# Patient Record
Sex: Male | Born: 1975 | Race: White | Hispanic: No | Marital: Married | Smoking: Current every day smoker
Health system: Southern US, Community
[De-identification: ages and names within clinical notes are randomized; demographics above are authoritative.]

## PROBLEM LIST (undated history)

## (undated) DIAGNOSIS — M47816 Spondylosis without myelopathy or radiculopathy, lumbar region: Secondary | ICD-10-CM

## (undated) DIAGNOSIS — Z87442 Personal history of urinary calculi: Secondary | ICD-10-CM

## (undated) DIAGNOSIS — Z87898 Personal history of other specified conditions: Secondary | ICD-10-CM

## (undated) DIAGNOSIS — K219 Gastro-esophageal reflux disease without esophagitis: Secondary | ICD-10-CM

## (undated) DIAGNOSIS — Z1211 Encounter for screening for malignant neoplasm of colon: Secondary | ICD-10-CM

## (undated) DIAGNOSIS — N200 Calculus of kidney: Secondary | ICD-10-CM

## (undated) DIAGNOSIS — F339 Major depressive disorder, recurrent, unspecified: Secondary | ICD-10-CM

## (undated) DIAGNOSIS — E66811 Obesity, class 1: Secondary | ICD-10-CM

## (undated) DIAGNOSIS — J302 Other seasonal allergic rhinitis: Secondary | ICD-10-CM

## (undated) DIAGNOSIS — M5412 Radiculopathy, cervical region: Secondary | ICD-10-CM

## (undated) DIAGNOSIS — F1411 Cocaine abuse, in remission: Secondary | ICD-10-CM

## (undated) DIAGNOSIS — F17200 Nicotine dependence, unspecified, uncomplicated: Secondary | ICD-10-CM

## (undated) DIAGNOSIS — N529 Male erectile dysfunction, unspecified: Secondary | ICD-10-CM

## (undated) DIAGNOSIS — F1011 Alcohol abuse, in remission: Secondary | ICD-10-CM

## (undated) DIAGNOSIS — N201 Calculus of ureter: Secondary | ICD-10-CM

## (undated) HISTORY — DX: Personal history of other specified conditions: Z87.898

## (undated) HISTORY — DX: Obesity, class 1: E66.811

## (undated) HISTORY — DX: Male erectile dysfunction, unspecified: N52.9

## (undated) HISTORY — DX: Other seasonal allergic rhinitis: J30.2

## (undated) HISTORY — DX: Nicotine dependence, unspecified, uncomplicated: F17.200

## (undated) HISTORY — DX: Cocaine abuse, in remission: F14.11

## (undated) HISTORY — DX: Calculus of ureter: N20.1

## (undated) HISTORY — DX: Major depressive disorder, recurrent, unspecified: F33.9

## (undated) HISTORY — DX: Calculus of kidney: N20.0

## (undated) HISTORY — DX: Spondylosis without myelopathy or radiculopathy, lumbar region: M47.816

## (undated) HISTORY — DX: Radiculopathy, cervical region: M54.12

## (undated) HISTORY — DX: Alcohol abuse, in remission: F10.11

## (undated) HISTORY — DX: Gastro-esophageal reflux disease without esophagitis: K21.9

## (undated) HISTORY — DX: Encounter for screening for malignant neoplasm of colon: Z12.11

---

## 2014-12-29 HISTORY — PX: UPPER GI ENDOSCOPY: SHX6162

## 2015-03-31 HISTORY — PX: BRAVO PH STUDY: SHX5421

## 2015-04-29 HISTORY — PX: ESOPHAGEAL MANOMETRY: SHX1526

## 2016-08-21 DIAGNOSIS — F17200 Nicotine dependence, unspecified, uncomplicated: Secondary | ICD-10-CM | POA: Diagnosis not present

## 2016-08-21 DIAGNOSIS — K219 Gastro-esophageal reflux disease without esophagitis: Secondary | ICD-10-CM | POA: Diagnosis not present

## 2016-08-21 DIAGNOSIS — N528 Other male erectile dysfunction: Secondary | ICD-10-CM | POA: Diagnosis not present

## 2017-02-22 DIAGNOSIS — F17200 Nicotine dependence, unspecified, uncomplicated: Secondary | ICD-10-CM | POA: Diagnosis not present

## 2017-02-22 DIAGNOSIS — K219 Gastro-esophageal reflux disease without esophagitis: Secondary | ICD-10-CM | POA: Diagnosis not present

## 2017-03-19 DIAGNOSIS — W57XXXA Bitten or stung by nonvenomous insect and other nonvenomous arthropods, initial encounter: Secondary | ICD-10-CM | POA: Diagnosis not present

## 2017-03-19 DIAGNOSIS — R5383 Other fatigue: Secondary | ICD-10-CM | POA: Diagnosis not present

## 2017-03-19 DIAGNOSIS — J0100 Acute maxillary sinusitis, unspecified: Secondary | ICD-10-CM | POA: Diagnosis not present

## 2017-03-19 DIAGNOSIS — R509 Fever, unspecified: Secondary | ICD-10-CM | POA: Diagnosis not present

## 2017-03-19 LAB — BASIC METABOLIC PANEL
BUN: 11 (ref 4–21)
Creatinine: 1.2 (ref <=1.3)
GLUCOSE: 90
POTASSIUM: 4 (ref 3.4–5.3)
SODIUM: 140 (ref 137–147)

## 2017-03-19 LAB — HEPATIC FUNCTION PANEL
ALK PHOS: 83 (ref 25–125)
ALT: 22 (ref 10–40)
AST: 27 (ref 14–40)
BILIRUBIN, TOTAL: 6.8

## 2017-03-19 LAB — CBC AND DIFFERENTIAL
HEMATOCRIT: 44 (ref 41–53)
HEMOGLOBIN: 14.9 (ref 13.5–17.5)
NEUTROS ABS: 2
Platelets: 205 (ref 150–399)
WBC: 5.7

## 2017-10-08 DIAGNOSIS — Z87898 Personal history of other specified conditions: Secondary | ICD-10-CM

## 2017-10-08 HISTORY — DX: Personal history of other specified conditions: Z87.898

## 2017-10-10 DIAGNOSIS — R05 Cough: Secondary | ICD-10-CM | POA: Diagnosis not present

## 2017-10-10 DIAGNOSIS — M47897 Other spondylosis, lumbosacral region: Secondary | ICD-10-CM | POA: Diagnosis not present

## 2017-10-10 DIAGNOSIS — M545 Low back pain: Secondary | ICD-10-CM | POA: Diagnosis not present

## 2017-10-10 DIAGNOSIS — H6693 Otitis media, unspecified, bilateral: Secondary | ICD-10-CM | POA: Diagnosis not present

## 2017-10-10 DIAGNOSIS — M549 Dorsalgia, unspecified: Secondary | ICD-10-CM | POA: Diagnosis not present

## 2017-10-10 DIAGNOSIS — H9203 Otalgia, bilateral: Secondary | ICD-10-CM | POA: Diagnosis not present

## 2017-10-29 ENCOUNTER — Encounter: Payer: Self-pay | Admitting: *Deleted

## 2017-10-29 ENCOUNTER — Telehealth: Payer: Self-pay | Admitting: *Deleted

## 2017-10-29 NOTE — Telephone Encounter (Signed)
New pt apt on 10/31/17.  Received medical records from [facility].  I reviewed records and abstracted information into pts chart.   Records have been placed on Dr. Samul DadaMcGowen's desk for review.

## 2017-10-30 ENCOUNTER — Encounter: Payer: Self-pay | Admitting: Family Medicine

## 2017-10-31 ENCOUNTER — Ambulatory Visit: Payer: Self-pay | Admitting: Family Medicine

## 2017-11-11 ENCOUNTER — Encounter: Payer: Self-pay | Admitting: Family Medicine

## 2017-11-11 ENCOUNTER — Ambulatory Visit: Payer: BLUE CROSS/BLUE SHIELD | Admitting: Family Medicine

## 2017-11-11 VITALS — BP 114/77 | HR 74 | Temp 98.3°F | Resp 16 | Ht 70.0 in | Wt 192.0 lb

## 2017-11-11 DIAGNOSIS — N200 Calculus of kidney: Secondary | ICD-10-CM

## 2017-11-11 DIAGNOSIS — Z8249 Family history of ischemic heart disease and other diseases of the circulatory system: Secondary | ICD-10-CM | POA: Diagnosis not present

## 2017-11-11 DIAGNOSIS — K219 Gastro-esophageal reflux disease without esophagitis: Secondary | ICD-10-CM

## 2017-11-11 MED ORDER — TAMSULOSIN HCL 0.4 MG PO CAPS: ORAL_CAPSULE | ORAL | 3 refills | Status: DC

## 2017-11-11 NOTE — Progress Notes (Signed)
Office Note 11/11/2017  CC:  Chief Complaint  Patient presents with  . Establish Care    Previous PCP: Dr. Durene CalHunter  . Follow-up    GERD, needs refill of Nexium    HPI:  Larry House is a 42 y.o. male who is here to establish care Patient's most recent primary MD:  Dr. Durene CalHunter with [facility] in [place], [place].  Old records in [facility] ([facility] visit 10/10/17) were reviewed prior to or during today's visit.  Hx of globus sensation, dx'd with GERD, started nexium and has been titrated up to 40 mg qd. Was still symptomatic and went to GI MD, got EGD---signif esophagitis.  Nissen procedure considered but pt declined. Feels 80% improved on daily nexium but if he misses a dose he feels significant rebound sx's. He has adjusted diet some, elevated head of bed.    Around 10/2017 he had a resp illness and ended up going to an UC and got abx. Got back x-ray while he was there and was told he had lumbar spondylitic changes with mild spondylolisthesis. He does endorse periods of LBP with radiating pain down right leg intermittently.  Has hx of recurrent kidney stones, says he passes them about 1 time per month on average, asks for rx for flomax b/c he says it has helped in the past when he is trying to pass a stone.    Past Medical History:  Diagnosis Date  . GERD (gastroesophageal reflux disease)    24h pH probe FAILED 03/31/15.  Hx of erosive esophagitis on EGD 03/31/15  . History of alcohol abuse   . History of cocaine abuse   . Kidney stones    Ca oxalate.  Flomax helps pass.  ++Recurrent.  . Lumbar spondylosis    with mild spondylolisthesis L5 on S1.  Intermittent LBP with R leg radiculopathy.  Toma Deiters. Malaise   . Organic erectile dysfunction   . Seasonal allergic rhinitis   . Tobacco dependence     Past Surgical History:  Procedure Laterality Date  . BRAVO PH STUDY  03/31/2015   Failed 48 - hour Bravo pH probe - Dr. Cammy BrochureMark Aldous  . ESOPHAGEAL MANOMETRY  04/29/2015    Essentially normal esophageal manometry. - Dr. Cammy BrochureMark Aldous  . UPPER GI ENDOSCOPY  12/29/2014   LA Grade A reflus esophagitis, otherwise normal. - Dr. Cammy BrochureMark Aldous    Family History  Problem Relation Age of Onset  . Arthritis Father   . Early death Father   . Heart attack Father   . Heart disease Father   . Hyperlipidemia Father   . Hypertension Father   . Early death Paternal Grandfather   . Hearing loss Paternal Grandfather   . Heart attack Paternal Grandfather   . Heart disease Paternal Grandfather   . Hyperlipidemia Paternal Grandfather   . Hypertension Paternal Grandfather     Social History   Socioeconomic History  . Marital status: Married    Spouse name: Not on file  . Number of children: Not on file  . Years of education: Not on file  . Highest education level: Not on file  Social Needs  . Financial resource strain: Not on file  . Food insecurity - worry: Not on file  . Food insecurity - inability: Not on file  . Transportation needs - medical: Not on file  . Transportation needs - non-medical: Not on file  Occupational History  . Not on file  Tobacco Use  . Smoking status:  Current Every Day Smoker    Packs/day: 1.00    Years: 26.00    Pack years: 26.00    Types: Cigarettes  . Smokeless tobacco: Never Used  Substance and Sexual Activity  . Alcohol use: Yes    Alcohol/week: 7.2 oz    Types: 12 Cans of beer per week  . Drug use: No  . Sexual activity: Not on file  Other Topics Concern  . Not on file  Social History Narrative   Married, no children.   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   Tob:25 pack-yr hx as of 11/2017.   Alc: 12 pack/week.  Denies hx of abuse.   Drugs:hx of street drug abuse in 68s, jail for a brief period, rehab.     Clean since 2001.    Outpatient Encounter Medications as of 11/11/2017  Medication Sig  . esomeprazole (NEXIUM) 40 MG capsule Take 40 mg by mouth daily at 12 noon.  . tamsulosin (FLOMAX) 0.4 MG CAPS  capsule 1 tab po qd prn kidney stone   No facility-administered encounter medications on file as of 11/11/2017.     No Known Allergies  ROS Review of Systems  Constitutional: Negative for fatigue and fever.  HENT: Negative for congestion and sore throat.   Eyes: Negative for visual disturbance.  Respiratory: Negative for cough.   Cardiovascular: Negative for chest pain.  Gastrointestinal: Negative for abdominal pain and nausea.  Genitourinary: Negative for dysuria.  Musculoskeletal: Positive for back pain (intermittent). Negative for joint swelling.  Skin: Negative for rash.  Neurological: Negative for weakness and headaches.  Hematological: Negative for adenopathy.    PE; Blood pressure 114/77, pulse 74, temperature 98.3 F (36.8 C), temperature source Oral, resp. rate 16, height 5\' 10"  (1.778 m), weight 192 lb (87.1 kg), SpO2 96 %. Gen: Alert, well appearing.  Patient is oriented to person, place, time, and situation. AFFECT: pleasant, lucid thought and speech. ZOX:WRUE: no injection, icteris, swelling, or exudate.  EOMI, PERRLA. Mouth: lips without lesion/swelling.  Oral mucosa pink and moist. Oropharynx without erythema, exudate, or swelling.  CV: RRR, no m/r/g.   LUNGS: CTA bilat, nonlabored resps, good aeration in all lung fields. EXT: no clubbing, cyanosis, or edema.   Pertinent labs:    Chemistry      Component Value Date/Time   NA 140 03/19/2017   K 4.0 03/19/2017   BUN 11 03/19/2017   CREATININE 1.2 03/19/2017   GLU 90 03/19/2017      Component Value Date/Time   ALKPHOS 83 03/19/2017   AST 27 03/19/2017   ALT 22 03/19/2017     Lab Results  Component Value Date   WBC 5.7 03/19/2017   HGB 14.9 03/19/2017   HCT 44 03/19/2017   PLT 205 03/19/2017     ASSESSMENT AND PLAN:   New pt: old records/prior PCP records have been reviewed.  1) GERD: continue nexium 40mg  qd, with appropriate dietary restrictions, elevation of head of bed. Try to limit ETOH use,  as this will make GER worse.  2) Recurrent kidney stones: flomax rx given since this has helped him pass stones easier in the past.  3) FH CAD: father and paternal GF (his dad died Jun 24, 2017). Encouraged pt to try to work on RF's: quitting smoking is the #1 thing he can do. Exercise is #2.  Discussed recommendations on these two issues today, pt not ready to quit smoking at this time but expressed understanding of exercise recommendations to limit CV  dz. Will check fasting gluc and FLP at CPE in 6 mo.  An After Visit Summary was printed and given to the patient.  Return in about 6 months (around 05/11/2018) for annual CPE (fasting).  Signed:  Santiago Bumpers, MD           11/11/2017

## 2018-01-06 ENCOUNTER — Other Ambulatory Visit: Payer: Self-pay | Admitting: Family Medicine

## 2018-01-06 MED ORDER — ESOMEPRAZOLE MAGNESIUM 40 MG PO CPDR: 40.0000 mg | DELAYED_RELEASE_CAPSULE | Freq: Every day | ORAL | 1 refills | Status: DC

## 2018-01-06 NOTE — Telephone Encounter (Signed)
Copied from CRM [id number]. Topic: General - Other >> Jan 06, 2018 12:21 PM Cecelia ByarsGreen, Mehar Sagen L, RMA wrote: Reason for CRM: Medication refill request for esomeprazole (NEXIUM) 40 MG capsule to be sent to [facility] [address] [place]

## 2018-01-06 NOTE — Telephone Encounter (Signed)
Omeprazole Last OV: 11/16/17 PCP: McGowen Pharmacy: [facility] [id number] - [place], [place] - [phone] (Phone) [phone] (Fax)

## 2018-01-16 ENCOUNTER — Encounter (HOSPITAL_COMMUNITY): Payer: Self-pay | Admitting: *Deleted

## 2018-01-16 ENCOUNTER — Encounter: Payer: Self-pay | Admitting: Family Medicine

## 2018-01-16 ENCOUNTER — Emergency Department (HOSPITAL_COMMUNITY)
Admission: EM | Admit: 2018-01-16 | Discharge: 2018-01-16 | Disposition: A | Discharge status: Home / Self Care | Payer: BLUE CROSS/BLUE SHIELD | Attending: Emergency Medicine | Admitting: Emergency Medicine

## 2018-01-16 ENCOUNTER — Emergency Department (HOSPITAL_COMMUNITY): Payer: BLUE CROSS/BLUE SHIELD

## 2018-01-16 DIAGNOSIS — F17210 Nicotine dependence, cigarettes, uncomplicated: Secondary | ICD-10-CM | POA: Diagnosis not present

## 2018-01-16 DIAGNOSIS — Z79899 Other long term (current) drug therapy: Secondary | ICD-10-CM | POA: Insufficient documentation

## 2018-01-16 DIAGNOSIS — R079 Chest pain, unspecified: Secondary | ICD-10-CM | POA: Insufficient documentation

## 2018-01-16 DIAGNOSIS — J302 Other seasonal allergic rhinitis: Secondary | ICD-10-CM | POA: Diagnosis not present

## 2018-01-16 LAB — CBC
HCT: 43.5 % (ref 39.0–52.0)
HEMOGLOBIN: 14.4 g/dL (ref 13.0–17.0)
MCH: 30.8 pg (ref 26.0–34.0)
MCHC: 33.1 g/dL (ref 30.0–36.0)
MCV: 92.9 fL (ref 78.0–100.0)
Platelets: 289 10*3/uL (ref 150–400)
RBC: 4.68 MIL/uL (ref 4.22–5.81)
RDW: 13.8 % (ref 11.5–15.5)
WBC: 10.8 10*3/uL — AB (ref 4.0–10.5)

## 2018-01-16 LAB — BASIC METABOLIC PANEL
ANION GAP: 11 (ref 5–15)
BUN: 10 mg/dL (ref 6–20)
CHLORIDE: 103 mmol/L (ref 101–111)
CO2: 26 mmol/L (ref 22–32)
Calcium: 9.4 mg/dL (ref 8.9–10.3)
Creatinine, Ser: 1.08 mg/dL (ref 0.61–1.24)
GFR calc non Af Amer: >60 mL/min (ref >60)
Glucose, Bld: 89 mg/dL (ref 65–99)
Potassium: 4.3 mmol/L (ref 3.5–5.1)
Sodium: 140 mmol/L (ref 135–145)

## 2018-01-16 LAB — I-STAT TROPONIN, ED
TROPONIN I, POC: 0 ng/mL (ref 0.00–0.08)
Troponin i, poc: 0 ng/mL (ref 0.00–0.08)

## 2018-01-16 NOTE — Discharge Instructions (Signed)
Please follow-up with the cardiology group listed below.  Please call them tomorrow to make and appointment.

## 2018-01-16 NOTE — Telephone Encounter (Signed)
Noted agree

## 2018-01-16 NOTE — Telephone Encounter (Signed)
Pt has apt for tomorrow (01/18/18) with Dr. Milinda CaveMcGowen at 3:00pm.   Please advise. Thanks.

## 2018-01-16 NOTE — ED Notes (Signed)
Pt. Refuses to let staff get vitals until he is brought back to see at doctor.

## 2018-01-16 NOTE — Telephone Encounter (Signed)
Reviewed pt's complaints + chart. He should NOT come to the office for a visit for these complaints. With chest pains, bilat leg weakness, and easy fatiguability he should go straight to the ER. Signed:  Santiago BumpersPhil Devonne Lalani, MD           01/16/2018

## 2018-01-16 NOTE — ED Provider Notes (Signed)
[facility] EMERGENCY [facility]PARTMENT Provider Note   CSN: [id number] Arrival date & time: 01/16/18  1648     History   Chief Complaint Chief Complaint  Patient presents with  . Chest Pain    HPI Larry House is a 42 y.o. male.  Patient presents to the ED with a chief complaint of chest pain.  He states that he has had intermittent chest pain for the past several months.  He reports that the pain generally comes when he is being active.  He reports associated SOB and sometimes has pain in his arms and legs.  He states that he last experienced the pain this afternoon and called his PCP and was advised to come to the ED.  He states that he has had no CP since arriving in the ED.  He reports an early family history of heart disease and states that his father had a stent placed at 6336.  He has seen cardiology in the past and had a normal stress test several years ago.    The history is provided by the patient. No language interpreter was used.    Past Medical History:  Diagnosis Date  . GERD (gastroesophageal reflux disease)    24h pH probe FAILED 03/31/15.  Hx of erosive esophagitis on EGD 03/31/15  . History of alcohol abuse   . History of cocaine abuse    s/p rehab 2001: no drug abuse since that time.  . Lumbar spondylosis    with mild spondylolisthesis L5 on S1.  Intermittent LBP with R leg radiculopathy.  . Organic erectile dysfunction   . Recurrent kidney stones    Ca++ oxalate.  Flomax helps pass.  ++Recurrent--has seen urologist in remote past.  . Seasonal allergic rhinitis   . Tobacco dependence     There are no active problems to display for this patient.   Past Surgical History:  Procedure Laterality Date  . BRAVO PH STUDY  03/31/2015   Failed 48 - hour Bravo pH probe - Dr. Cammy BrochureMark Aldous  . ESOPHAGEAL MANOMETRY  04/29/2015   Essentially normal esophageal manometry. - Dr. Cammy BrochureMark Aldous  . UPPER GI ENDOSCOPY  12/29/2014   LA Grade A reflus esophagitis,  otherwise normal. - Dr. Cammy BrochureMark Aldous        Home Medications    Prior to Admission medications   Medication Sig Start Date End Date Taking? Authorizing Provider  esomeprazole (NEXIUM) 40 MG capsule Take 1 capsule (40 mg total) by mouth daily at 12 noon. 01/06/18   McGowen, Maryjean MornPhilip H, MD  tamsulosin (FLOMAX) 0.4 MG CAPS capsule 1 tab po qd prn kidney stone 11/11/17   McGowen, Maryjean MornPhilip H, MD    Family History Family History  Problem Relation Age of Onset  . Arthritis Father   . Early death Father   . Heart attack Father   . Heart disease Father   . Hyperlipidemia Father   . Hypertension Father   . Early death Paternal Grandfather   . Hearing loss Paternal Grandfather   . Heart attack Paternal Grandfather   . Heart disease Paternal Grandfather   . Hyperlipidemia Paternal Grandfather   . Hypertension Paternal Grandfather     Social History Social History   Tobacco Use  . Smoking status: Current Every Day Smoker    Packs/day: 1.00    Years: 26.00    Pack years: 26.00    Types: Cigarettes  . Smokeless tobacco: Never Used  Substance Use Topics  . Alcohol  use: Yes    Alcohol/week: 7.2 oz    Types: 12 Cans of beer per week  . Drug use: No     Allergies   Patient has no known allergies.   Review of Systems Review of Systems  All other systems reviewed and are negative.    Physical Exam Updated Vital Signs BP 137/89 (BP Location: Right Arm)   Pulse 78   Temp 99.2 F (37.3 C) (Oral)   Resp 18   Ht 5\' 9"  (1.753 m)   Wt 86.2 kg (190 lb)   SpO2 98%   BMI 28.06 kg/m   Physical Exam  Constitutional: He is oriented to person, place, and time. He appears well-developed and well-nourished.  HENT:  Head: Normocephalic and atraumatic.  Eyes: Pupils are equal, round, and reactive to light. Conjunctivae and EOM are normal. Right eye exhibits no discharge. Left eye exhibits no discharge. No scleral icterus.  Neck: Normal range of motion. Neck supple. No JVD present.    Cardiovascular: Normal rate, regular rhythm and normal heart sounds. Exam reveals no gallop and no friction rub.  No murmur heard. Pulmonary/Chest: Effort normal and breath sounds normal. No respiratory distress. He has no wheezes. He has no rales. He exhibits no tenderness.  Abdominal: Soft. He exhibits no distension and no mass. There is no tenderness. There is no rebound and no guarding.  Musculoskeletal: Normal range of motion. He exhibits no edema or tenderness.  Neurological: He is alert and oriented to person, place, and time.  Skin: Skin is warm and dry.  Psychiatric: He has a normal mood and affect. His behavior is normal. Judgment and thought content normal.  Nursing note and vitals reviewed.    ED Treatments / Results  Labs (all labs ordered are listed, but only abnormal results are displayed) Labs Reviewed  CBC - Abnormal; Notable for the following components:      Result Value   WBC 10.8 (*)    All other components within normal limits  BASIC METABOLIC PANEL  I-STAT TROPONIN, ED  I-STAT TROPONIN, ED    EKG None  Radiology Dg Chest 2 View  Result Date: 01/16/2018 CLINICAL DATA:  Intermittent chest pain and weakness for the past few months. EXAM: CHEST - 2 VIEW COMPARISON:  None. FINDINGS: The heart size and mediastinal contours are within normal limits. Both lungs are clear. The visualized skeletal structures are unremarkable. IMPRESSION: No active cardiopulmonary disease. Electronically Signed   By: Obie Dredge M.D.   On: 01/16/2018 18:24    Procedures Procedures (including critical care time)  Medications Ordered in ED Medications - No data to display   Initial Impression / Assessment and Plan / ED Course  I have reviewed the triage vital signs and the nursing notes.  Pertinent labs & imaging results that were available during my care of the patient were reviewed by me and considered in my medical decision making (see chart for details).     Patient  presents with chest pain intermittently x several months.  Had more symptoms this afternoon.  None since arrival in ED.  DDx includes ACS, PE, pneumothorax, aortic dissection, esophageal rupture, pericarditis, chest wall pain.  Doubt ACS, normal troponin, no ischemic EKG findings, HEART score is: 3.  Doubt PE, patient is not tachycardic nor hypoxic.  No evidence of pneumothorax on CXR.  Doubt dissection, no mediastinal widening on CXR, no ripping/tearing chest pain, neurovascularly intact.  Doubt pericarditis, no positional changes, or diffuse ST elevations on EKG.  Delta troponin is negative.  Patient is currently pain free.  Recommend close follow-up with cardiology.  All findings were discussed with patient.  Patient understands and agrees with the plan.     Final Clinical Impressions(s) / ED Diagnoses   Final diagnoses:  Chest pain, unspecified type    ED Discharge Orders    None       Roxy Horseman, PA-C 01/16/18 2253    Tegeler, Canary Brim, MD 01/17/18 0009

## 2018-01-16 NOTE — ED Triage Notes (Signed)
Pt in c/o intermittent chest pain and weakness for the last several months, states symptoms are getting progressively worse, denies pain at this time, no distress noted

## 2018-01-16 NOTE — Telephone Encounter (Signed)
Spoke with patient regarding symptoms. Patient stated this was a bad time to talk and asked if I could call back in a few minutes. Advised patient that PCP is recommending he go to the ER for evaluation and not wait for appointment tomorrow. Patient verbalized understanding.

## 2018-01-17 ENCOUNTER — Ambulatory Visit: Payer: BLUE CROSS/BLUE SHIELD | Admitting: Family Medicine

## 2018-01-17 DIAGNOSIS — Z0289 Encounter for other administrative examinations: Secondary | ICD-10-CM

## 2018-02-04 ENCOUNTER — Ambulatory Visit: Payer: BLUE CROSS/BLUE SHIELD | Admitting: Interventional Cardiology

## 2018-02-04 ENCOUNTER — Encounter: Payer: Self-pay | Admitting: Interventional Cardiology

## 2018-02-04 VITALS — BP 112/76 | HR 97 | Ht 69.0 in | Wt 188.6 lb

## 2018-02-04 DIAGNOSIS — R079 Chest pain, unspecified: Secondary | ICD-10-CM | POA: Diagnosis not present

## 2018-02-04 DIAGNOSIS — Z8249 Family history of ischemic heart disease and other diseases of the circulatory system: Secondary | ICD-10-CM | POA: Insufficient documentation

## 2018-02-04 DIAGNOSIS — Z720 Tobacco use: Secondary | ICD-10-CM | POA: Diagnosis not present

## 2018-02-04 NOTE — Progress Notes (Signed)
Cardiology Office Note   Date:  02/04/2018   ID:  Larry House, DOB 1976/04/17, MRN [id number]  PCP:  Jeoffrey Massed, MD    No chief complaint on file.  Chest pain  Wt Readings from Last 3 Encounters:  02/04/18 188 lb 9.6 oz (85.5 kg)  01/16/18 190 lb (86.2 kg)  11/11/17 192 lb (87.1 kg)       History of Present Illness: Larry House is a 42 y.o. male who is being seen today for the evaluation of chest pain at the request of McGowen, Maryjean Morn, MD.  He went to the ER in 4/19.  Records revealed:"he has had intermittent chest pain for the past several months.  He reports that the pain generally comes when he is being active.  He reports associated SOB and sometimes has pain in his arms and legs.  He states that he last experienced the pain this afternoon and called his PCP and was advised to come to the ED.  He states that he has had no CP since arriving in the ED.  He reports an early family history of heart disease and states that his father had a stent placed at 81.  He has seen cardiology in the past and had a normal stress test several years ago. "  He had trouble standing still.  He felt better walking.  He has had occasional chest pains on occasion.  He had a negative stress test in the CLT area.   Family history significant for father having an MI, after a negative stress test.  He had PTCA in his 30s.  Uncle, Grandfather had heart disease.  His chest pain has no trigger.  It feels like a squeezing.  Not related to exertion.  Pain can last up to several minutes.  He has no faith in stress tests.     Past Medical History:  Diagnosis Date  . GERD (gastroesophageal reflux disease)    24h pH probe FAILED 03/31/15.  Hx of erosive esophagitis on EGD 03/31/15  . History of alcohol abuse   . History of cocaine abuse    s/p rehab 2001: no drug abuse since that time.  . Lumbar spondylosis    with mild spondylolisthesis L5 on S1.  Intermittent LBP with R leg  radiculopathy.  . Organic erectile dysfunction   . Recurrent kidney stones    Ca++ oxalate.  Flomax helps pass.  ++Recurrent--has seen urologist in remote past.  . Seasonal allergic rhinitis   . Tobacco dependence     Past Surgical History:  Procedure Laterality Date  . BRAVO PH STUDY  03/31/2015   Failed 48 - hour Bravo pH probe - Dr. Cammy Brochure  . ESOPHAGEAL MANOMETRY  04/29/2015   Essentially normal esophageal manometry. - Dr. Cammy Brochure  . UPPER GI ENDOSCOPY  12/29/2014   LA Grade A reflus esophagitis, otherwise normal. - Dr. Cammy Brochure     Current Outpatient Medications  Medication Sig Dispense Refill  . esomeprazole (NEXIUM) 40 MG capsule Take 1 capsule (40 mg total) by mouth daily at 12 noon. 90 capsule 1  . tamsulosin (FLOMAX) 0.4 MG CAPS capsule 1 tab po qd prn kidney stone 30 capsule 3   No current facility-administered medications for this visit.     Allergies:   Patient has no known allergies.    Social History:  The patient  reports that he has been smoking cigarettes.  He has a 26.00 pack-year smoking history. He  has never used smokeless tobacco. He reports that he drinks about 7.2 oz of alcohol per week. He reports that he does not use drugs.   Family History:  The patient's family history includes Arthritis in his father; Early death in his father and paternal grandfather; Hearing loss in his paternal grandfather; Heart attack in his father and paternal grandfather; Heart disease in his father and paternal grandfather; Hyperlipidemia in his father and paternal grandfather; Hypertension in his father and paternal grandfather.    ROS:  Please see the history of present illness.   Otherwise, review of systems are positive for unable to quit smoking- did not tolerate Chantix.   All other systems are reviewed and negative.    PHYSICAL EXAM: VS:  BP 112/76   Pulse 97   Ht  (1.753 m)   Wt 188 lb 9.6 oz (85.5 kg)   SpO2 97%   BMI 27.85 kg/m  , BMI Body  mass index is 27.85 kg/m. GEN: Well nourished, well developed, in no acute distress  HEENT: normal  Neck: no JVD, carotid bruits, or masses Cardiac: RRR; no murmurs, rubs, or gallops,no edema  Respiratory:  clear to auscultation bilaterally, normal work of breathing GI: soft, nontender, nondistended, + BS MS: no deformity or atrophy  Skin: warm and dry, no rash Neuro:  Strength and sensation are intact Psych: euthymic mood, full affect   EKG:   The ekg ordered 4/11 demonstrates normal ECG   Recent Labs: 03/19/2017: ALT 22 01/16/2018: BUN 10; Creatinine, Ser 1.08; Hemoglobin 14.4; Platelets 289; Potassium 4.3; Sodium 140   Lipid Panel No results found for: CHOL, TRIG, HDL, CHOLHDL, VLDL, LDLCALC, LDLDIRECT   Other studies Reviewed: Additional studies/ records that were reviewed today with results demonstrating: ER records reviewed.   ASSESSMENT AND PLAN:  1. Chest pain: several atypical features, but RF for CAD including family history and tobacco abuse.  He has had recurrrent sx and feel they are intensifying.  He has had some arm sx.  He report some leg pain as well.  We discussed options including invasive cath, CT vs stress test.  He prefers cath.  THe risks and benefits of cath were explained to the patient and he agrees.  All questions answered.  Plan for radial approach. 2. Tobacco abuse: He needs to stop smoking.  He did not tolerate Chantix.   I explained to him the idea of plaque rupture, and the reason why RF modification is important.   3. Family h/o CAD: He needs to exercise regularly.  I talked about decreased sugar intake.    Current medicines are reviewed at length with the patient today.  The patient concerns regarding his medicines were addressed.  The following changes have been made:  No change  Labs/ tests ordered today include:  No orders of the defined types were placed in this encounter.   Recommend 150 minutes/week of aerobic exercise Low fat, low  carb, high fiber diet recommended  Disposition:   FU for cath   Signed, Lance Muss, MD  02/04/2018 11:51 AM    [facility] [address] [place], [place], [place]  [address] Phone: [phone]; Fax: [phone]

## 2018-02-04 NOTE — Patient Instructions (Signed)
Medication Instructions:  Your physician recommends that you continue on your current medications as directed. Please refer to the Current Medication list given to you today.   Labwork: TODAY: CBC, BMET  Testing/Procedures: Your physician has requested that you have a cardiac catheterization on 02/07/18. Cardiac catheterization is used to diagnose and/or treat various heart conditions. Doctors may recommend this procedure for a number of different reasons. The most common reason is to evaluate chest pain. Chest pain can be a symptom of coronary artery disease (CAD), and cardiac catheterization can show whether plaque is narrowing or blocking your heart's arteries. This procedure is also used to evaluate the valves, as well as measure the blood flow and oxygen levels in different parts of your heart. For further information please visit [email or web address] Please follow instruction sheet, as given.  Follow-Up: Your physician wants you to follow-up  2-3 weeks after heart catheterization with Dr. Eldridge Dace or APP on his team   Any Other Special Instructions Will Be Listed Below (If Applicable).    [facility] CARDIOVASCULAR DIVISION [facility] ST OFFICE [address] [place] [address] Dept: [phone] Loc: [phone]  Larry House  02/04/2018  You are scheduled for a Cardiac Catheterization on Friday, May 3 with Dr. Lance Muss.  1. Please arrive at the [facility] (Main Entrance A) at [facility]: [address] [place], [place] [address] at 5:30 AM (two hours before your procedure to ensure your preparation). Free valet parking service is available.   Special note: Every effort is made to have your procedure done on time. Please understand that emergencies sometimes delay scheduled procedures.  2. Diet: Do not eat or drink anything after midnight prior to your procedure except sips of water to take  medications.  3. Labs: TODAY: CBC, BMET  4. Medication instructions in preparation for your procedure:  On the morning of your procedure, take a baby Aspirin 81 mg any of your regular morning medicines.  You may use sips of water.  5. Plan for one night stay--bring personal belongings. 6. Bring a current list of your medications and current insurance cards. 7. You MUST have a responsible person to drive you home. 8. Someone MUST be with you the first 24 hours after you arrive home or your discharge will be delayed. 9. Please wear clothes that are easy to get on and off and wear slip-on shoes.  Thank you for allowing [place] to care for you!   -- [facility]    If you need a refill on your cardiac medications before your next appointment, please call your pharmacy.   Coronary Angiogram A coronary angiogram is an X-ray procedure that is used to examine the arteries in the heart. In this procedure, a dye (contrast dye) is injected through a long, thin tube (catheter). The catheter is inserted through the groin, wrist, or arm. The dye is injected into each artery, then X-rays are taken to show if there is a blockage in the arteries of the heart. This procedure can also show if you have valve disease or a disease of the aorta, and it can be used to check the overall function of your heart muscle. You may have a coronary angiogram if:  You are having chest pain, or other symptoms of angina, and you are at risk for heart disease.  You have an abnormal electrocardiogram (ECG) or stress test.  You have chest pain and heart failure.  You are having irregular heart rhythms.  You and your health care provider determine that the benefits of the test information outweigh the risks of the procedure.  Let your health care provider know about:  Any allergies you have, including allergies to contrast dye.  All medicines you are taking, including vitamins, herbs, eye  drops, creams, and over-the-counter medicines.  Any problems you or family members have had with anesthetic medicines.  Any blood disorders you have.  Any surgeries you have had.  History of kidney problems or kidney failure.  Any medical conditions you have.  Whether you are pregnant or may be pregnant. What are the risks? Generally, this is a safe procedure. However, problems may occur, including:  Infection.  Allergic reaction to medicines or dyes that are used.  Bleeding from the access site or other locations.  Kidney injury, especially in people with impaired kidney function.  Stroke (rare).  Heart attack (rare).  Damage to other structures or organs.  What happens before the procedure? Staying hydrated Follow instructions from your health care provider about hydration, which may include:  Up to 2 hours before the procedure - you may continue to drink clear liquids, such as water, clear fruit juice, black coffee, and plain tea.  Eating and drinking restrictions Follow instructions from your health care provider about eating and drinking, which may include:  8 hours before the procedure - stop eating heavy meals or foods such as meat, fried foods, or fatty foods.  6 hours before the procedure - stop eating light meals or foods, such as toast or cereal.  2 hours before the procedure - stop drinking clear liquids.  General instructions  Ask your health care provider about: ? Changing or stopping your regular medicines. This is especially important if you are taking diabetes medicines or blood thinners. ? Taking medicines such as ibuprofen. These medicines can thin your blood. Do not take these medicines before your procedure if your health care provider instructs you not to, though aspirin may be recommended prior to coronary angiograms.  Plan to have someone take you home from the hospital or clinic.  You may need to have blood tests or X-rays done. What  happens during the procedure?  An IV tube will be inserted into one of your veins.  You will be given one or more of the following: ? A medicine to help you relax (sedative). ? A medicine to numb the area where the catheter will be inserted into an artery (local anesthetic).  To reduce your risk of infection: ? Your health care team will wash or sanitize their hands. ? Your skin will be washed with soap. ? Hair may be removed from the area where the catheter will be inserted.  You will be connected to a continuous ECG monitor.  The catheter will be inserted into an artery. The location may be in your groin, in your wrist, or in the fold of your arm (near your elbow).  A type of X-ray (fluoroscopy) will be used to help guide the catheter to the opening of the blood vessel that is being examined.  A dye will be injected into the catheter, and X-rays will be taken. The dye will help to show where any narrowing or blockages are located in the heart arteries.  Tell your health care provider if you have any chest pain or trouble breathing during the procedure.  If blockages are found, your health care provider may perform another procedure, such as inserting  a coronary stent. The procedure may vary among health care providers and hospitals. What happens after the procedure?  After the procedure, you will need to keep the area still for a few hours, or for as long as told by your health care provider. If the procedure is done through the groin, you will be instructed to not bend and not cross your legs.  The insertion site will be checked frequently.  The pulse in your foot or wrist will be checked frequently.  You may have additional blood tests, X-rays, and a test that records the electrical activity of your heart (ECG).  Do not drive for 24 hours if you were given a sedative. Summary  A coronary angiogram is an X-ray procedure that is used to look into the arteries in the  heart.  During the procedure, a dye (contrast dye) is injected through a long, thin tube (catheter). The catheter is inserted through the groin, wrist, or arm.  Tell your health care provider about any allergies you have, including allergies to contrast dye.  After the procedure, you will need to keep the area still for a few hours, or for as long as told by your health care provider. This information is not intended to replace advice given to you by your health care provider. Make sure you discuss any questions you have with your health care provider. Document Released: 03/31/2003 Document Revised: 07/06/2016 Document Reviewed: 07/06/2016 Elsevier Interactive Patient Education  [employer].

## 2018-02-04 NOTE — H&P (View-Only) (Signed)
Cardiology Office Note   Date:  02/04/2018   ID:  Larry House, DOB 1976/04/17, MRN [id number]  PCP:  Jeoffrey Massed, MD    No chief complaint on file.  Chest pain  Wt Readings from Last 3 Encounters:  02/04/18 188 lb 9.6 oz (85.5 kg)  01/16/18 190 lb (86.2 kg)  11/11/17 192 lb (87.1 kg)       History of Present Illness: Larry House is a 42 y.o. male who is being seen today for the evaluation of chest pain at the request of McGowen, Maryjean Morn, MD.  He went to the ER in 4/19.  Records revealed:"he has had intermittent chest pain for the past several months.  He reports that the pain generally comes when he is being active.  He reports associated SOB and sometimes has pain in his arms and legs.  He states that he last experienced the pain this afternoon and called his PCP and was advised to come to the ED.  He states that he has had no CP since arriving in the ED.  He reports an early family history of heart disease and states that his father had a stent placed at 81.  He has seen cardiology in the past and had a normal stress test several years ago. "  He had trouble standing still.  He felt better walking.  He has had occasional chest pains on occasion.  He had a negative stress test in the CLT area.   Family history significant for father having an MI, after a negative stress test.  He had PTCA in his 30s.  Uncle, Grandfather had heart disease.  His chest pain has no trigger.  It feels like a squeezing.  Not related to exertion.  Pain can last up to several minutes.  He has no faith in stress tests.     Past Medical History:  Diagnosis Date  . GERD (gastroesophageal reflux disease)    24h pH probe FAILED 03/31/15.  Hx of erosive esophagitis on EGD 03/31/15  . History of alcohol abuse   . History of cocaine abuse    s/p rehab 2001: no drug abuse since that time.  . Lumbar spondylosis    with mild spondylolisthesis L5 on S1.  Intermittent LBP with R leg  radiculopathy.  . Organic erectile dysfunction   . Recurrent kidney stones    Ca++ oxalate.  Flomax helps pass.  ++Recurrent--has seen urologist in remote past.  . Seasonal allergic rhinitis   . Tobacco dependence     Past Surgical History:  Procedure Laterality Date  . BRAVO PH STUDY  03/31/2015   Failed 48 - hour Bravo pH probe - Dr. Cammy Brochure  . ESOPHAGEAL MANOMETRY  04/29/2015   Essentially normal esophageal manometry. - Dr. Cammy Brochure  . UPPER GI ENDOSCOPY  12/29/2014   LA Grade A reflus esophagitis, otherwise normal. - Dr. Cammy Brochure     Current Outpatient Medications  Medication Sig Dispense Refill  . esomeprazole (NEXIUM) 40 MG capsule Take 1 capsule (40 mg total) by mouth daily at 12 noon. 90 capsule 1  . tamsulosin (FLOMAX) 0.4 MG CAPS capsule 1 tab po qd prn kidney stone 30 capsule 3   No current facility-administered medications for this visit.     Allergies:   Patient has no known allergies.    Social History:  The patient  reports that he has been smoking cigarettes.  He has a 26.00 pack-year smoking history. He  has never used smokeless tobacco. He reports that he drinks about 7.2 oz of alcohol per week. He reports that he does not use drugs.   Family History:  The patient's family history includes Arthritis in his father; Early death in his father and paternal grandfather; Hearing loss in his paternal grandfather; Heart attack in his father and paternal grandfather; Heart disease in his father and paternal grandfather; Hyperlipidemia in his father and paternal grandfather; Hypertension in his father and paternal grandfather.    ROS:  Please see the history of present illness.   Otherwise, review of systems are positive for unable to quit smoking- did not tolerate Chantix.   All other systems are reviewed and negative.    PHYSICAL EXAM: VS:  BP 112/76   Pulse 97   Ht 5' 9" (1.753 m)   Wt 188 lb 9.6 oz (85.5 kg)   SpO2 97%   BMI 27.85 kg/m  , BMI Body  mass index is 27.85 kg/m. GEN: Well nourished, well developed, in no acute distress  HEENT: normal  Neck: no JVD, carotid bruits, or masses Cardiac: RRR; no murmurs, rubs, or gallops,no edema  Respiratory:  clear to auscultation bilaterally, normal work of breathing GI: soft, nontender, nondistended, + BS MS: no deformity or atrophy  Skin: warm and dry, no rash Neuro:  Strength and sensation are intact Psych: euthymic mood, full affect   EKG:   The ekg ordered 4/11 demonstrates normal ECG   Recent Labs: 03/19/2017: ALT 22 01/16/2018: BUN 10; Creatinine, Ser 1.08; Hemoglobin 14.4; Platelets 289; Potassium 4.3; Sodium 140   Lipid Panel No results found for: CHOL, TRIG, HDL, CHOLHDL, VLDL, LDLCALC, LDLDIRECT   Other studies Reviewed: Additional studies/ records that were reviewed today with results demonstrating: ER records reviewed.   ASSESSMENT AND PLAN:  1. Chest pain: several atypical features, but RF for CAD including family history and tobacco abuse.  He has had recurrrent sx and feel they are intensifying.  He has had some arm sx.  He report some leg pain as well.  We discussed options including invasive cath, CT vs stress test.  He prefers cath.  THe risks and benefits of cath were explained to the patient and he agrees.  All questions answered.  Plan for radial approach. 2. Tobacco abuse: He needs to stop smoking.  He did not tolerate Chantix.   I explained to him the idea of plaque rupture, and the reason why RF modification is important.   3. Family h/o CAD: He needs to exercise regularly.  I talked about decreased sugar intake.    Current medicines are reviewed at length with the patient today.  The patient concerns regarding his medicines were addressed.  The following changes have been made:  No change  Labs/ tests ordered today include:  No orders of the defined types were placed in this encounter.   Recommend 150 minutes/week of aerobic exercise Low fat, low  carb, high fiber diet recommended  Disposition:   FU for cath   Signed, Tavaris Eudy, MD  02/04/2018 11:51 AM    [facility] [address], [place], [place]  [address] Phone: [phone]; Fax: [phone]   

## 2018-02-05 LAB — CBC
HEMATOCRIT: 44.1 % (ref 37.5–51.0)
HEMOGLOBIN: 15.3 g/dL (ref 13.0–17.7)
MCH: 31.5 pg (ref 26.6–33.0)
MCHC: 34.7 g/dL (ref 31.5–35.7)
MCV: 91 fL (ref 79–97)
Platelets: 313 10*3/uL (ref 150–379)
RBC: 4.86 x10E6/uL (ref 4.14–5.80)
RDW: 13.7 % (ref 12.3–15.4)
WBC: 8.5 10*3/uL (ref 3.4–10.8)

## 2018-02-05 LAB — BASIC METABOLIC PANEL
BUN/Creatinine Ratio: 10 (ref 9–20)
BUN: 10 mg/dL (ref 6–20)
CALCIUM: 9.7 mg/dL (ref 8.7–10.2)
CO2: 25 mmol/L (ref 20–29)
Chloride: 102 mmol/L (ref 96–106)
Creatinine, Ser: 1.03 mg/dL (ref 0.76–1.27)
GFR, EST AFRICAN AMERICAN: 105 mL/min/{1.73_m2} (ref >59)
GFR, EST NON AFRICAN AMERICAN: 91 mL/min/{1.73_m2} (ref >59)
Glucose: 118 mg/dL — ABNORMAL HIGH (ref 65–99)
POTASSIUM: 5.5 mmol/L — AB (ref 3.5–5.2)
Sodium: 141 mmol/L (ref 134–144)

## 2018-02-06 ENCOUNTER — Telehealth: Payer: Self-pay | Admitting: *Deleted

## 2018-02-06 NOTE — Telephone Encounter (Signed)
Pt contacted pre-catheterization scheduled at [facility] for: Friday May 3,2019 7:30 AM Verified arrival time and place: [facility] Main Entrance A at: 5:30 AM  No solid food after midnight prior to cath, clear liquids until 5 AM Verified allergies in Epic Verified no diabetes medications.  AM meds can be  taken pre-cath with sip of water including: ASA 81 mg  Confirmed patient has responsible person to drive home post procedure and observe patient for 24 hours: yes

## 2018-02-07 ENCOUNTER — Ambulatory Visit (HOSPITAL_COMMUNITY)
Admission: RE | Admit: 2018-02-07 | Discharge: 2018-02-07 | Disposition: A | Discharge status: Home / Self Care | Payer: BLUE CROSS/BLUE SHIELD | Source: Ambulatory Visit | Attending: Interventional Cardiology | Admitting: Interventional Cardiology

## 2018-02-07 ENCOUNTER — Ambulatory Visit (HOSPITAL_COMMUNITY)
Admission: RE | Disposition: A | Discharge status: Home / Self Care | Payer: Self-pay | Source: Ambulatory Visit | Attending: Interventional Cardiology

## 2018-02-07 ENCOUNTER — Encounter (HOSPITAL_COMMUNITY): Payer: Self-pay | Admitting: Interventional Cardiology

## 2018-02-07 DIAGNOSIS — Z8249 Family history of ischemic heart disease and other diseases of the circulatory system: Secondary | ICD-10-CM | POA: Insufficient documentation

## 2018-02-07 DIAGNOSIS — M4726 Other spondylosis with radiculopathy, lumbar region: Secondary | ICD-10-CM | POA: Diagnosis not present

## 2018-02-07 DIAGNOSIS — K219 Gastro-esophageal reflux disease without esophagitis: Secondary | ICD-10-CM | POA: Insufficient documentation

## 2018-02-07 DIAGNOSIS — F17210 Nicotine dependence, cigarettes, uncomplicated: Secondary | ICD-10-CM | POA: Insufficient documentation

## 2018-02-07 DIAGNOSIS — R072 Precordial pain: Secondary | ICD-10-CM | POA: Insufficient documentation

## 2018-02-07 HISTORY — PX: LEFT HEART CATH AND CORONARY ANGIOGRAPHY: CATH118249

## 2018-02-07 SURGERY — LEFT HEART CATH AND CORONARY ANGIOGRAPHY
Anesthesia: LOCAL

## 2018-02-07 MED ORDER — SODIUM CHLORIDE 0.9 % IV SOLN: 250.0000 mL | INTRAVENOUS | Status: DC | PRN

## 2018-02-07 MED ORDER — ONDANSETRON HCL 4 MG/2ML IJ SOLN: 4.0000 mg | Freq: Four times a day (QID) | INTRAMUSCULAR | Status: DC | PRN

## 2018-02-07 MED ORDER — SODIUM CHLORIDE 0.9% FLUSH: 3.0000 mL | Freq: Two times a day (BID) | INTRAVENOUS | Status: DC

## 2018-02-07 MED ORDER — SODIUM CHLORIDE 0.9% FLUSH: 3.0000 mL | INTRAVENOUS | Status: DC | PRN

## 2018-02-07 MED ORDER — ASPIRIN 81 MG PO CHEW: 81.0000 mg | CHEWABLE_TABLET | ORAL | Status: DC

## 2018-02-07 MED ORDER — LIDOCAINE HCL (PF) 1 % IJ SOLN
INTRAMUSCULAR | Status: DC | PRN
  Administered 2018-02-07: 3 mL

## 2018-02-07 MED ORDER — VERAPAMIL HCL 2.5 MG/ML IV SOLN
INTRAVENOUS | Status: DC | PRN
  Administered 2018-02-07 (×2): via INTRA_ARTERIAL

## 2018-02-07 MED ORDER — MIDAZOLAM HCL 2 MG/2ML IJ SOLN
INTRAMUSCULAR | Status: DC | PRN
  Administered 2018-02-07: 2 mg via INTRAVENOUS
  Administered 2018-02-07: 1 mg via INTRAVENOUS

## 2018-02-07 MED ORDER — FENTANYL CITRATE (PF) 100 MCG/2ML IJ SOLN
INTRAMUSCULAR | Status: AC
  Filled 2018-02-07: qty 2

## 2018-02-07 MED ORDER — SODIUM CHLORIDE 0.9 % WEIGHT BASED INFUSION: 1.0000 mL/kg/h | INTRAVENOUS | Status: DC

## 2018-02-07 MED ORDER — MIDAZOLAM HCL 2 MG/2ML IJ SOLN
INTRAMUSCULAR | Status: AC
  Filled 2018-02-07: qty 2

## 2018-02-07 MED ORDER — FENTANYL CITRATE (PF) 100 MCG/2ML IJ SOLN
INTRAMUSCULAR | Status: DC | PRN
  Administered 2018-02-07 (×2): 25 ug via INTRAVENOUS

## 2018-02-07 MED ORDER — SODIUM CHLORIDE 0.9 % WEIGHT BASED INFUSION
3.0000 mL/kg/h | INTRAVENOUS | Status: DC
  Administered 2018-02-07: 3 mL/kg/h via INTRAVENOUS

## 2018-02-07 MED ORDER — HEPARIN SODIUM (PORCINE) 1000 UNIT/ML IJ SOLN
INTRAMUSCULAR | Status: AC
  Filled 2018-02-07: qty 1

## 2018-02-07 MED ORDER — HEPARIN (PORCINE) IN NACL 2-0.9 UNITS/ML
INTRAMUSCULAR | Status: AC | PRN
  Administered 2018-02-07 (×2): 500 mL via INTRA_ARTERIAL

## 2018-02-07 MED ORDER — SODIUM CHLORIDE 0.9 % IV SOLN: INTRAVENOUS | Status: DC

## 2018-02-07 MED ORDER — ACETAMINOPHEN 325 MG PO TABS: 650.0000 mg | ORAL_TABLET | ORAL | Status: DC | PRN

## 2018-02-07 MED ORDER — HEPARIN (PORCINE) IN NACL 1000-0.9 UT/500ML-% IV SOLN
INTRAVENOUS | Status: AC
  Filled 2018-02-07: qty 1000

## 2018-02-07 MED ORDER — HEPARIN SODIUM (PORCINE) 1000 UNIT/ML IJ SOLN
INTRAMUSCULAR | Status: DC | PRN
  Administered 2018-02-07: 4500 [IU] via INTRAVENOUS

## 2018-02-07 MED ORDER — LIDOCAINE HCL (PF) 1 % IJ SOLN
INTRAMUSCULAR | Status: AC
  Filled 2018-02-07: qty 30

## 2018-02-07 MED ORDER — VERAPAMIL HCL 2.5 MG/ML IV SOLN
INTRAVENOUS | Status: AC
  Filled 2018-02-07: qty 2

## 2018-02-07 MED ORDER — IOHEXOL 350 MG/ML SOLN
INTRAVENOUS | Status: DC | PRN
  Administered 2018-02-07: 80 mL via INTRA_ARTERIAL

## 2018-02-07 SURGICAL SUPPLY — 11 items
CATH INFINITI 5 FR JL3.5 (CATHETERS) ×2 IMPLANT
CATH INFINITI JR4 5F (CATHETERS) ×2 IMPLANT
DEVICE RAD COMP TR BAND LRG (VASCULAR PRODUCTS) ×2 IMPLANT
GLIDESHEATH SLEND SS 6F .021 (SHEATH) ×2 IMPLANT
GUIDEWIRE INQWIRE 1.5J.035X260 (WIRE) ×2 IMPLANT
INQWIRE 1.5J .035X260CM (WIRE) ×4
KIT HEART LEFT (KITS) ×2 IMPLANT
PACK CARDIAC CATHETERIZATION (CUSTOM PROCEDURE TRAY) ×2 IMPLANT
TRANSDUCER W/STOPCOCK (MISCELLANEOUS) ×2 IMPLANT
TUBING CIL FLEX 10 FLL-RA (TUBING) ×2 IMPLANT
WIRE HI TORQ VERSACORE-J 145CM (WIRE) ×2 IMPLANT

## 2018-02-07 NOTE — Research (Signed)
CADFEM Informed Consent   Subject Name: Larry House  Subject met inclusion and exclusion criteria.  The informed consent form, study requirements and expectations were reviewed with the subject and questions and concerns were addressed prior to the signing of the consent form.  The subject verbalized understanding of the trail requirements.  The subject agreed to participate in the CADFEM trial and signed the informed consent.  The informed consent was obtained prior to performance of any protocol-specific procedures for the subject.  A copy of the signed informed consent was given to the subject and a copy was placed in the subject's medical record.  Christena Flake 02/07/2018, 06:45 AM

## 2018-02-07 NOTE — Discharge Instructions (Signed)
Drink plenty of fluids over next 48 hours and keep wrist heart level for 24 hours  Radial Site Care Refer to this sheet in the next few weeks. These instructions provide you with information about caring for yourself after your procedure. Your health care provider may also give you more specific instructions. Your treatment has been planned according to current medical practices, but problems sometimes occur. Call your health care provider if you have any problems or questions after your procedure. What can I expect after the procedure? After your procedure, it is typical to have the following:  Bruising at the radial site that usually fades within 1-2 weeks.  Blood collecting in the tissue (hematoma) that may be painful to the touch. It should usually decrease in size and tenderness within 1-2 weeks.  Follow these instructions at home:  Take medicines only as directed by your health care provider.  You may shower 24-48 hours after the procedure or as directed by your health care provider. Remove the bandage (dressing) and gently wash the site with plain soap and water. Pat the area dry with a clean towel. Do not rub the site, because this may cause bleeding.  Do not take baths, swim, or use a hot tub until your health care provider approves.  Check your insertion site every day for redness, swelling, or drainage.  Do not apply powder or lotion to the site.  Do not flex or bend the affected arm for 24 hours or as directed by your health care provider.  Do not push or pull heavy objects with the affected arm for 24 hours or as directed by your health care provider.  Do not lift over 10 lb (4.5 kg) for 5 days after your procedure or as directed by your health care provider.  Ask your health care provider when it is okay to: ? Return to work or school. ? Resume usual physical activities or sports. ? Resume sexual activity.  Do not drive home if you are discharged the same day as the  procedure. Have someone else drive you.  You may drive 24 hours after the procedure unless otherwise instructed by your health care provider.  Do not operate machinery or power tools for 24 hours after the procedure.  If your procedure was done as an outpatient procedure, which means that you went home the same day as your procedure, a responsible adult should be with you for the first 24 hours after you arrive home.  Keep all follow-up visits as directed by your health care provider. This is important. Contact a health care provider if:  You have a fever.  You have chills.  You have increased bleeding from the radial site. Hold pressure on the site. Get help right away if:  You have unusual pain at the radial site.  You have redness, warmth, or swelling at the radial site.  You have drainage (other than a small amount of blood on the dressing) from the radial site.  The radial site is bleeding, and the bleeding does not stop after 30 minutes of holding steady pressure on the site.  Your arm or hand becomes pale, cool, tingly, or numb. This information is not intended to replace advice given to you by your health care provider. Make sure you discuss any questions you have with your health care provider. Document Released: 10/27/2010 Document Revised: 03/01/2016 Document Reviewed: 04/12/2014 Elsevier Interactive Patient Education  2018 [employer].

## 2018-02-07 NOTE — Interval H&P Note (Signed)
Cath Lab Visit (complete for each Cath Lab visit)  Clinical Evaluation Leading to the Procedure:   ACS: No.  Non-ACS:    Anginal Classification: CCS III  Anti-ischemic medical therapy: No Therapy  Non-Invasive Test Results: No non-invasive testing performed  Prior CABG: No previous CABG   Strong family history with multiple RF.  He is having accelerating sx of chest pain and declines stress testing due to its lack of accuracy.   History and Physical Interval Note:  02/07/2018 7:24 AM  Larry House  has presented today for surgery, with the diagnosis of Chest Pain  The various methods of treatment have been discussed with the patient and family. After consideration of risks, benefits and other options for treatment, the patient has consented to  Procedure(s): LEFT HEART CATH AND CORONARY ANGIOGRAPHY (N/A) as a surgical intervention .  The patient's history has been reviewed, patient examined, no change in status, stable for surgery.  I have reviewed the patient's chart and labs.  Questions were answered to the patient's satisfaction.     Lance Muss

## 2018-02-11 MED FILL — Heparin Sod (Porcine)-NaCl IV Soln 1000 Unit/500ML-0.9%: INTRAVENOUS | Qty: 1000 | Status: AC

## 2018-02-12 ENCOUNTER — Encounter: Payer: Self-pay | Admitting: Family Medicine

## 2018-03-05 ENCOUNTER — Ambulatory Visit: Payer: BLUE CROSS/BLUE SHIELD | Admitting: Physician Assistant

## 2018-04-03 ENCOUNTER — Ambulatory Visit: Payer: BLUE CROSS/BLUE SHIELD | Admitting: Cardiology

## 2018-04-03 ENCOUNTER — Encounter: Payer: Self-pay | Admitting: Cardiology

## 2018-04-03 VITALS — BP 124/78 | HR 82 | Ht 69.0 in | Wt 192.0 lb

## 2018-04-03 DIAGNOSIS — R0789 Other chest pain: Secondary | ICD-10-CM | POA: Diagnosis not present

## 2018-04-03 NOTE — Patient Instructions (Signed)

## 2018-04-03 NOTE — Progress Notes (Signed)
04/03/2018 Larry House   [birth year]  [id number]  Primary Physician McGowen, Maryjean Morn, MD Primary Cardiologist: Dr. Eldridge Dace   Reason for Visit/CC: Post Cardiac Cath f/u   HPI:  Larry House is a 42 y.o. male who is being seen today for cardiac catheterization follow-up.  He was recently seen by Dr. Eldridge Dace and complained of chest pain that was concerning for possible cardiac etiology.  His cardiac risk factors include a strong family history of premature coronary artery disease in his father as well as a personal history of tobacco use.  He also has GERD, treated w/ Nexium.  He is followed routinely by his PCP.  There is no personal history of diabetes or hyperlipidemia.  He works for [employer] and often has to undergo comprehensive yearly exams.  Catheterization was performed in May 2018 and showed no evidence of coronary artery disease.  No aortic stenosis.  Left ventricular ejection fraction was normal at 55 to 65% by visual estimate.  Dr. Eldridge Dace recommended continued preventative therapy including smoking cessation.  He presents to clinic today for post catheterization follow-up.  He denies any complications.  Right radial cath site is stable with 2+ radial pulse.  No numbness or tingling.  Blood pressures well controlled without medications at 124/78.  He continues to smoke but is making efforts to reduce consumption.  Cardiac Studies  Larry House  CARDIAC CATHETERIZATION  Order# [id number]  Reading physician: Corky Crafts, MD Ordering physician: Corky Crafts, MD Study date: 02/07/18  Physicians   Panel Physicians Referring Physician Case Authorizing Physician  Corky Crafts, MD (Primary)    Procedures   LEFT HEART CATH AND CORONARY ANGIOGRAPHY  Conclusion     The left ventricular systolic function is normal.  LV end diastolic pressure is normal.  The left ventricular ejection fraction is 55-65% by visual estimate.  There  is no aortic valve stenosis.   No angiographically apparent coronary artery disease.  Continue preventive therapy including smoking cessation.        Current Meds  Medication Sig  . esomeprazole (NEXIUM) 40 MG capsule Take 40 mg by mouth daily at 12 noon.  . fluticasone (FLONASE) 50 MCG/ACT nasal spray Place 1 spray into both nostrils daily as needed for allergies.  Marland Kitchen ibuprofen (ADVIL,MOTRIN) 200 MG tablet Take 400 mg by mouth every 8 (eight) hours as needed (for pain.).  Marland Kitchen tadalafil (CIALIS) 10 MG tablet Take 10 mg by mouth daily as needed for erectile dysfunction.  . tamsulosin (FLOMAX) 0.4 MG CAPS capsule Take 0.4 mg by mouth as needed (kidney stone).   No Known Allergies Past Medical History:  Diagnosis Date  . GERD (gastroesophageal reflux disease)    24h pH probe FAILED 03/31/15.  Hx of erosive esophagitis on EGD 03/31/15  . History of alcohol abuse   . History of cocaine abuse    s/p rehab 2001: no drug abuse since that time.  . Lumbar spondylosis    with mild spondylolisthesis L5 on S1.  Intermittent LBP with R leg radiculopathy.  . Organic erectile dysfunction   . Recurrent kidney stones    Ca++ oxalate.  Flomax helps pass.  ++Recurrent--has seen urologist in remote past.  . Seasonal allergic rhinitis   . Tobacco dependence    Family History  Problem Relation Age of Onset  . Arthritis Father   . Early death Father   . Heart attack Father   . Heart disease Father   . Hyperlipidemia  Father   . Hypertension Father   . Early death Paternal Grandfather   . Hearing loss Paternal Grandfather   . Heart attack Paternal Grandfather   . Heart disease Paternal Grandfather   . Hyperlipidemia Paternal Grandfather   . Hypertension Paternal Grandfather    Past Surgical History:  Procedure Laterality Date  . BRAVO PH STUDY  03/31/2015   Failed 48 - hour Bravo pH probe - Dr. Cammy BrochureMark House  . ESOPHAGEAL MANOMETRY  04/29/2015   Essentially normal esophageal manometry. - Dr. Cammy BrochureMark  House  . LEFT HEART CATH AND CORONARY ANGIOGRAPHY N/A 02/07/2018   No CAD.  EF normal.  Procedure: LEFT HEART CATH AND CORONARY ANGIOGRAPHY;  Surgeon: Corky CraftsVaranasi, Jayadeep S, MD;  [facility];  Service: Cardiovascular;  Laterality: N/A;  . UPPER GI ENDOSCOPY  12/29/2014   LA Grade A reflus esophagitis, otherwise normal. - Dr. Cammy BrochureMark House   Social History   Socioeconomic History  . Marital status: Married    Spouse name: Not on file  . Number of children: Not on file  . Years of education: Not on file  . Highest education level: Not on file  Occupational History  . Not on file  Social Needs  . Financial resource strain: Not on file  . Food insecurity:    Worry: Not on file    Inability: Not on file  . Transportation needs:    Medical: Not on file    Non-medical: Not on file  Tobacco Use  . Smoking status: Current Every Day Smoker    Packs/day: 1.00    Years: 26.00    Pack years: 26.00    Types: Cigarettes  . Smokeless tobacco: Never Used  Substance and Sexual Activity  . Alcohol use: Yes    Alcohol/week: 7.2 oz    Types: 12 Cans of beer per week  . Drug use: No  . Sexual activity: Not on file  Lifestyle  . Physical activity:    Days per week: Not on file    Minutes per session: Not on file  . Stress: Not on file  Relationships  . Social connections:    Talks on phone: Not on file    Gets together: Not on file    Attends religious service: Not on file    Active member of club or organization: Not on file    Attends meetings of clubs or organizations: Not on file    Relationship status: Not on file  . Intimate partner violence:    Fear of current or ex partner: Not on file    Emotionally abused: Not on file    Physically abused: Not on file    Forced sexual activity: Not on file  Other Topics Concern  . Not on file  Social History Narrative   Married, no children.   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   Tob:25 pack-yr hx as of 11/2017.    Alc: 12 pack/week.  Denies hx of abuse.   Drugs:hx of street drug abuse in [place], jail for a brief period, rehab.     Clean since 2001.     Review of Systems: General: negative for chills, fever, night sweats or weight changes.  Cardiovascular: negative for chest pain, dyspnea on exertion, edema, orthopnea, palpitations, paroxysmal nocturnal dyspnea or shortness of breath Dermatological: negative for rash Respiratory: negative for cough or wheezing Urologic: negative for hematuria Abdominal: negative for nausea, vomiting, diarrhea, bright red blood per rectum, melena,  or hematemesis Neurologic: negative for visual changes, syncope, or dizziness All other systems reviewed and are otherwise negative except as noted above.   Physical Exam:  Blood pressure 124/78, pulse 82, height 5\' 9"  (1.753 m), weight 192 lb (87.1 kg), SpO2 97 %.  General appearance: alert, cooperative and no distress Neck: no carotid bruit and no JVD Lungs: clear to auscultation bilaterally Heart: regular rate and rhythm, S1, S2 normal, no murmur, click, rub or gallop Extremities: extremities normal, atraumatic, no cyanosis or edema Pulses: 2+ and symmetric Skin: Skin color, texture, turgor normal. No rashes or lesions Neurologic: Grossly normal  EKG not performed -- personally reviewed   ASSESSMENT AND PLAN:   1. Noncardiac Chest Pain: Cardiac Cath 02/2018 showed no evidence of CAD. No Aortic Stenosis. EF normal. Preventative management advised, including smoking cessation. We discussed the importance of this today. He will continue routine f/u with PCP for yearly comprehensive exams to monitor/ prevent development of other cardiac risk factors. He can f/u with [facility] PRN.    Follow-Up PRN  Knute Neu, MHS [facility] 04/03/2018 8:41 AM

## 2018-05-12 ENCOUNTER — Encounter: Payer: BLUE CROSS/BLUE SHIELD | Admitting: Family Medicine

## 2018-05-12 DIAGNOSIS — Z0289 Encounter for other administrative examinations: Secondary | ICD-10-CM

## 2018-05-26 ENCOUNTER — Ambulatory Visit (INDEPENDENT_AMBULATORY_CARE_PROVIDER_SITE_OTHER): Payer: BLUE CROSS/BLUE SHIELD | Admitting: Family Medicine

## 2018-05-26 ENCOUNTER — Encounter: Payer: Self-pay | Admitting: Family Medicine

## 2018-05-26 VITALS — BP 112/74 | HR 76 | Temp 98.1°F | Resp 16 | Ht 69.5 in | Wt 192.1 lb

## 2018-05-26 DIAGNOSIS — Z0000 Encounter for general adult medical examination without abnormal findings: Secondary | ICD-10-CM | POA: Diagnosis not present

## 2018-05-26 DIAGNOSIS — E663 Overweight: Secondary | ICD-10-CM | POA: Diagnosis not present

## 2018-05-26 NOTE — Patient Instructions (Signed)

## 2018-05-26 NOTE — Progress Notes (Signed)
Office Note 05/26/2018  CC:  Chief Complaint  Patient presents with  . Annual Exam    Pt is not fasting.    HPI:  Larry House is a 42 y.o.  male who is here for annual health maintenance exam. Stressed out with working on building a a garage at home.--running into some hoops he's got to jump through regarding permits, etc. Otherwise no acute complaints. He got biometric labs via employer 3 wks ago and says he'll bring these back to me first chance he gets.  Tobacco: 1 ppd currently.  Given current stress level he doesn't want to quit right now. In future, when stress better, he thinks he'll make a good effort at quitting.  Successful with wellbutrin in the past as well as "cold [place]".  Exercise: physically demanding jobs, 15-20 thousand steps per day. Diet: not very healthy, likes fried food.  No vegetables.   Past Medical History:  Diagnosis Date  . GERD (gastroesophageal reflux disease)    24h pH probe FAILED 03/31/15.  Hx of erosive esophagitis on EGD 03/31/15  . History of alcohol abuse   . History of chest pain 2019   Cath 02/2018 (pt declined stress testing b/c he has little faith in them): entirely clean.  Marland Kitchen. History of cocaine abuse    s/p rehab 2001: no drug abuse since that time.  . Lumbar spondylosis    with mild spondylolisthesis L5 on S1.  Intermittent LBP with R leg radiculopathy.  . Organic erectile dysfunction   . Recurrent kidney stones    Ca++ oxalate.  Flomax helps pass.  ++Recurrent--has seen urologist in remote past.  . Seasonal allergic rhinitis   . Tobacco dependence     Past Surgical History:  Procedure Laterality Date  . BRAVO PH STUDY  03/31/2015   Failed 48 - hour Bravo pH probe - Dr. Cammy BrochureMark Aldous  . ESOPHAGEAL MANOMETRY  04/29/2015   Essentially normal esophageal manometry. - Dr. Cammy BrochureMark Aldous  . LEFT HEART CATH AND CORONARY ANGIOGRAPHY N/A 02/07/2018   No CAD.  EF normal.  Procedure: LEFT HEART CATH AND CORONARY ANGIOGRAPHY;  Surgeon:  Corky CraftsVaranasi, Jayadeep S, MD;  [facility];  Service: Cardiovascular;  Laterality: N/A;  . UPPER GI ENDOSCOPY  12/29/2014   LA Grade A reflus esophagitis, otherwise normal. - Dr. Cammy BrochureMark Aldous    Family History  Problem Relation Age of Onset  . Arthritis Father   . Early death Father   . Heart attack Father   . Heart disease Father   . Hyperlipidemia Father   . Hypertension Father   . Early death Paternal Grandfather   . Hearing loss Paternal Grandfather   . Heart attack Paternal Grandfather   . Heart disease Paternal Grandfather   . Hyperlipidemia Paternal Grandfather   . Hypertension Paternal Grandfather     Social History   Socioeconomic History  . Marital status: Married    Spouse name: Not on file  . Number of children: Not on file  . Years of education: Not on file  . Highest education level: Not on file  Occupational History  . Not on file  Social Needs  . Financial resource strain: Not on file  . Food insecurity:    Worry: Not on file    Inability: Not on file  . Transportation needs:    Medical: Not on file    Non-medical: Not on file  Tobacco Use  . Smoking status: Current Every Day Smoker  Packs/day: 1.00    Years: 26.00    Pack years: 26.00    Types: Cigarettes  . Smokeless tobacco: Never Used  Substance and Sexual Activity  . Alcohol use: Yes    Alcohol/week: 12.0 standard drinks    Types: 12 Cans of beer per week  . Drug use: No  . Sexual activity: Not on file  Lifestyle  . Physical activity:    Days per week: Not on file    Minutes per session: Not on file  . Stress: Not on file  Relationships  . Social connections:    Talks on phone: Not on file    Gets together: Not on file    Attends religious service: Not on file    Active member of club or organization: Not on file    Attends meetings of clubs or organizations: Not on file    Relationship status: Not on file  . Intimate partner violence:    Fear of current or ex  partner: Not on file    Emotionally abused: Not on file    Physically abused: Not on file    Forced sexual activity: Not on file  Other Topics Concern  . Not on file  Social History Narrative   Married, no children.   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   Tob:25 pack-yr hx as of 11/2017.   Alc: 12 pack/week.  Denies hx of abuse.   Drugs:hx of street drug abuse in [place], jail for a brief period, rehab.     Clean since 2001.    Outpatient Medications Prior to Visit  Medication Sig Dispense Refill  . esomeprazole (NEXIUM) 40 MG capsule Take 40 mg by mouth daily at 12 noon.    . fluticasone (FLONASE) 50 MCG/ACT nasal spray Place 1 spray into both nostrils daily as needed for allergies.    Marland Kitchen. ibuprofen (ADVIL,MOTRIN) 200 MG tablet Take 400 mg by mouth every 8 (eight) hours as needed (for pain.).    Marland Kitchen. tadalafil (CIALIS) 10 MG tablet Take 10 mg by mouth daily as needed for erectile dysfunction.    . tamsulosin (FLOMAX) 0.4 MG CAPS capsule Take 0.4 mg by mouth as needed (kidney stone).     No facility-administered medications prior to visit.     No Known Allergies  ROS Review of Systems  Constitutional: Negative for appetite change, chills, fatigue and fever.  HENT: Negative for congestion, dental problem, ear pain and sore throat.   Eyes: Negative for discharge, redness and visual disturbance.  Respiratory: Negative for cough, chest tightness, shortness of breath and wheezing.   Cardiovascular: Negative for chest pain, palpitations and leg swelling.  Gastrointestinal: Negative for abdominal pain, blood in stool, diarrhea, nausea and vomiting.  Genitourinary: Negative for difficulty urinating, dysuria, flank pain, frequency, hematuria and urgency.  Musculoskeletal: Negative for arthralgias, back pain, joint swelling, myalgias and neck stiffness.  Skin: Negative for pallor and rash.  Neurological: Negative for dizziness, speech difficulty, weakness and headaches.   Hematological: Negative for adenopathy. Does not bruise/bleed easily.  Psychiatric/Behavioral: Negative for confusion and sleep disturbance. The patient is not nervous/anxious.     PE; Blood pressure 112/74, pulse 76, temperature 98.1 F (36.7 C), temperature source Oral, resp. rate 16, height 5' 9.5" (1.765 m), weight 192 lb 2 oz (87.1 kg), SpO2 98 %. Body mass index is 27.97 kg/m.  Gen: Alert, well appearing.  Patient is oriented to person, place, time, and situation. AFFECT: pleasant, lucid thought and speech. ENT:  Ears: EACs clear, normal epithelium.  TMs with good light reflex and landmarks bilaterally.  Eyes: no injection, icteris, swelling, or exudate.  EOMI, PERRLA. Nose: no drainage or turbinate edema/swelling.  No injection or focal lesion.  Mouth: lips without lesion/swelling.  Oral mucosa pink and moist.  Dentition intact and without obvious caries or gingival swelling.  Oropharynx without erythema, exudate, or swelling.  Neck: supple/nontender.  No LAD, mass, or TM.  Carotid pulses 2+ bilaterally, without bruits. CV: RRR, no m/r/g.   LUNGS: CTA bilat, nonlabored resps, good aeration in all lung fields. ABD: soft, NT, ND, BS normal.  No hepatospenomegaly or mass.  No bruits. EXT: no clubbing, cyanosis, or edema.  Musculoskeletal: no joint swelling, erythema, warmth, or tenderness.  ROM of all joints intact. Skin - no sores or suspicious lesions or rashes or color changes   Pertinent labs:  No results found for: TSH Lab Results  Component Value Date   WBC 8.5 02/04/2018   HGB 15.3 02/04/2018   HCT 44.1 02/04/2018   MCV 91 02/04/2018   PLT 313 02/04/2018   Lab Results  Component Value Date   CREATININE 1.03 02/04/2018   BUN 10 02/04/2018   NA 141 02/04/2018   K 5.5 (H) 02/04/2018   CL 102 02/04/2018   CO2 25 02/04/2018   Lab Results  Component Value Date   ALT 22 03/19/2017   AST 27 03/19/2017   ALKPHOS 83 03/19/2017   No results found for: CHOL No results  found for: HDL No results found for: LDLCALC No results found for: TRIG No results found for: CHOLHDL   ASSESSMENT AND PLAN:   Health maintenance exam: Reviewed age and gender appropriate health maintenance issues (prudent diet, regular exercise, health risks of tobacco and excessive alcohol, use of seatbelts, fire alarms in home, use of sunscreen).  Also reviewed age and gender appropriate health screening as well as vaccine recommendations. Vaccines: he plans on getting his seasonal flu vaccine at work. Labs: he'll bring in labs done via his employer about 3 wks ago. Emphasized need to increase CV exercise: goal of 30 min of CV exercise 5/7 days per week. Diet changes recommended. Encouraged complete smoking cessation, which he is currently not contemplating as long as his stress level is high.  An After Visit Summary was printed and given to the patient.  FOLLOW UP:  Return in about 1 year (around 05/27/2019) for annual CPE (fasting).   Signed:  Santiago Bumpers, MD           05/26/2018

## 2018-06-17 ENCOUNTER — Encounter: Payer: Self-pay | Admitting: Family Medicine

## 2018-06-17 ENCOUNTER — Ambulatory Visit: Payer: BLUE CROSS/BLUE SHIELD | Admitting: Family Medicine

## 2018-06-17 VITALS — BP 120/74 | HR 81 | Temp 98.4°F | Resp 16 | Ht 69.5 in | Wt 190.0 lb

## 2018-06-17 DIAGNOSIS — J209 Acute bronchitis, unspecified: Secondary | ICD-10-CM

## 2018-06-17 DIAGNOSIS — F17200 Nicotine dependence, unspecified, uncomplicated: Secondary | ICD-10-CM | POA: Diagnosis not present

## 2018-06-17 DIAGNOSIS — J069 Acute upper respiratory infection, unspecified: Secondary | ICD-10-CM

## 2018-06-17 MED ORDER — ALBUTEROL SULFATE HFA 108 (90 BASE) MCG/ACT IN AERS: 2.0000 | INHALATION_SPRAY | Freq: Four times a day (QID) | RESPIRATORY_TRACT | 1 refills | Status: DC | PRN

## 2018-06-17 MED ORDER — PREDNISONE 20 MG PO TABS: ORAL_TABLET | ORAL | 0 refills | Status: DC

## 2018-06-17 MED ORDER — AZITHROMYCIN 250 MG PO TABS: ORAL_TABLET | ORAL | 0 refills | Status: DC

## 2018-06-17 NOTE — Patient Instructions (Signed)
Get otc generic robitussin DM OR Mucinex DM and use as directed on the packaging for cough and congestion. Use otc generic saline nasal spray 2-3 times per day to irrigate/moisturize your nasal passages.   

## 2018-06-17 NOTE — Progress Notes (Signed)
OFFICE VISIT  06/17/2018   CC:  Chief Complaint  Patient presents with  . Cough    sinus pressure ,fever, chills     HPI:    Patient is a 42 y.o.  male who presents for respiratory symptoms. Onset 7 d/a with some ST, nasal congestion.  Neti pot and mucinex no help. Sx's continued to worsen and cough productive of green sputum started coming.  Feels subjective f/c on and off, as recent as yesterday.  Felt SOB and burning in chest with strenuous activity at work yesterday.   He is a current smoker. Lots of sick contacts at work lately. No n/v/d or rash.  No pain in face, no signif HA.  Past Medical History:  Diagnosis Date  . GERD (gastroesophageal reflux disease)    24h pH probe FAILED 03/31/15.  Hx of erosive esophagitis on EGD 03/31/15  . History of alcohol abuse   . History of chest pain 2019   Cath 02/2018 (pt declined stress testing b/c he has little faith in them): entirely clean.  Marland Kitchen History of cocaine abuse    s/p rehab 2001: no drug abuse since that time.  . Lumbar spondylosis    with mild spondylolisthesis L5 on S1.  Intermittent LBP with R leg radiculopathy.  . Organic erectile dysfunction   . Recurrent kidney stones    Ca++ oxalate.  Flomax helps pass.  ++Recurrent--has seen urologist in remote past.  . Seasonal allergic rhinitis   . Tobacco dependence     Past Surgical History:  Procedure Laterality Date  . BRAVO PH STUDY  03/31/2015   Failed 48 - hour Bravo pH probe - Dr. Cammy Brochure  . ESOPHAGEAL MANOMETRY  04/29/2015   Essentially normal esophageal manometry. - Dr. Cammy Brochure  . LEFT HEART CATH AND CORONARY ANGIOGRAPHY N/A 02/07/2018   No CAD.  EF normal.  Procedure: LEFT HEART CATH AND CORONARY ANGIOGRAPHY;  Surgeon: Corky Crafts, MD;  Location: [facility] INVASIVE CV LAB;  Service: Cardiovascular;  Laterality: N/A;  . UPPER GI ENDOSCOPY  12/29/2014   LA Grade A reflus esophagitis, otherwise normal. - Dr. Cammy Brochure    Outpatient Medications Prior to  Visit  Medication Sig Dispense Refill  . esomeprazole (NEXIUM) 40 MG capsule Take 40 mg by mouth daily at 12 noon.    . fluticasone (FLONASE) 50 MCG/ACT nasal spray Place 1 spray into both nostrils daily as needed for allergies.    Marland Kitchen ibuprofen (ADVIL,MOTRIN) 200 MG tablet Take 400 mg by mouth every 8 (eight) hours as needed (for pain.).    Marland Kitchen tadalafil (CIALIS) 10 MG tablet Take 10 mg by mouth daily as needed for erectile dysfunction.    . tamsulosin (FLOMAX) 0.4 MG CAPS capsule Take 0.4 mg by mouth as needed (kidney stone).     No facility-administered medications prior to visit.     No Known Allergies  ROS As per HPI  PE: Blood pressure 120/74, pulse 81, temperature 98.4 F (36.9 C), temperature source Oral, resp. rate 16, height 5' 9.5" (1.765 m), weight 190 lb (86.2 kg), SpO2 97 %. VS: noted--normal. Gen: alert, NAD, NONTOXIC APPEARING. HEENT: eyes without injection, drainage, or swelling.  Ears: EACs clear, TMs with normal light reflex and landmarks.  Nose: Clear rhinorrhea, with some dried, crusty exudate adherent to mildly injected mucosa.  No purulent d/c.  No paranasal sinus TTP.  No facial swelling.  Throat and mouth without focal lesion.  No pharyngial swelling, erythema, or exudate.   Neck:  supple, no LAD.   LUNGS: CTA bilat, nonlabored resps.  Many times he takes a deep breath he coughs a lot before I can hear an exhalation.  Clear on exhalation when he does not have coughing fit. CV: RRR, no m/r/g. EXT: no c/c/e SKIN: no rash  LABS:    Chemistry      Component Value Date/Time   NA 141 02/04/2018 1246   K 5.5 (H) 02/04/2018 1246   CL 102 02/04/2018 1246   CO2 25 02/04/2018 1246   BUN 10 02/04/2018 1246   CREATININE 1.03 02/04/2018 1246   GLU 90 03/19/2017      Component Value Date/Time   CALCIUM 9.7 02/04/2018 1246   ALKPHOS 83 03/19/2017   AST 27 03/19/2017   ALT 22 03/19/2017       IMPRESSION AND PLAN:  URI with acute bronchitis, possibly bacterial.   Current smoker. Prednisone 40mg  qd x 5d, then 20mg  qd x 5d. Albut 2 p q4h prn. Zpack. Get otc generic robitussin DM OR Mucinex DM and use as directed on the packaging for cough and congestion. Use otc generic saline nasal spray 2-3 times per day to irrigate/moisturize your nasal passages. Encouraged complete smoking cessation.  An After Visit Summary was printed and given to the patient.  FOLLOW UP: Return if symptoms worsen or fail to improve.  Signed:  Santiago Bumpers, MD           06/17/2018

## 2018-10-02 ENCOUNTER — Other Ambulatory Visit: Payer: Self-pay | Admitting: Family Medicine

## 2018-10-02 NOTE — Telephone Encounter (Signed)
RF request for nexium.  Last OV was 05/26/18 for CPE.  I don't see that Nexium was ever prescribed by Dr Milinda CaveMcGowen. Please advise.

## 2018-10-08 DIAGNOSIS — N201 Calculus of ureter: Secondary | ICD-10-CM

## 2018-10-08 HISTORY — DX: Calculus of ureter: N20.1

## 2018-10-21 ENCOUNTER — Telehealth: Payer: Self-pay | Admitting: *Deleted

## 2018-10-21 DIAGNOSIS — N200 Calculus of kidney: Secondary | ICD-10-CM

## 2018-10-21 NOTE — Telephone Encounter (Signed)
Please advise. Thanks.  

## 2018-10-21 NOTE — Telephone Encounter (Signed)
Copied from CRM [id number]. Topic: Referral - Request for Referral >> Oct 20, 2018  4:08 PM Baldo Daub L wrote: Has patient seen PCP for this complaint? yes *If NO, is insurance requiring patient see PCP for this issue before PCP can refer them? Referral for which specialty: urologist Preferred provider/office: unknown - new to area Reason for referral: chronic kidney stones  Pt can be reached at [phone]

## 2018-10-22 NOTE — Telephone Encounter (Signed)
Referral was entered. Pt notified. Pt verbalized understanding.

## 2018-10-24 ENCOUNTER — Encounter (HOSPITAL_COMMUNITY): Payer: Self-pay | Admitting: General Practice

## 2018-10-24 ENCOUNTER — Other Ambulatory Visit: Payer: Self-pay | Admitting: Urology

## 2018-10-24 DIAGNOSIS — N132 Hydronephrosis with renal and ureteral calculous obstruction: Secondary | ICD-10-CM | POA: Diagnosis not present

## 2018-10-24 DIAGNOSIS — N201 Calculus of ureter: Secondary | ICD-10-CM | POA: Diagnosis not present

## 2018-10-27 ENCOUNTER — Ambulatory Visit (HOSPITAL_COMMUNITY): Payer: BLUE CROSS/BLUE SHIELD

## 2018-10-27 ENCOUNTER — Ambulatory Visit (HOSPITAL_COMMUNITY)
Admission: RE | Admit: 2018-10-27 | Discharge: 2018-10-27 | Disposition: A | Discharge status: Home / Self Care | Payer: BLUE CROSS/BLUE SHIELD | Attending: Urology | Admitting: Urology

## 2018-10-27 ENCOUNTER — Encounter (HOSPITAL_COMMUNITY)
Admission: RE | Disposition: A | Discharge status: Home / Self Care | Payer: Self-pay | Source: Home / Self Care | Attending: Urology

## 2018-10-27 ENCOUNTER — Encounter (HOSPITAL_COMMUNITY): Payer: Self-pay | Admitting: *Deleted

## 2018-10-27 DIAGNOSIS — N201 Calculus of ureter: Secondary | ICD-10-CM | POA: Insufficient documentation

## 2018-10-27 DIAGNOSIS — F17200 Nicotine dependence, unspecified, uncomplicated: Secondary | ICD-10-CM | POA: Insufficient documentation

## 2018-10-27 HISTORY — DX: Personal history of urinary calculi: Z87.442

## 2018-10-27 HISTORY — PX: EXTRACORPOREAL SHOCK WAVE LITHOTRIPSY: SHX1557

## 2018-10-27 SURGERY — LITHOTRIPSY, ESWL
Anesthesia: LOCAL | Laterality: Left

## 2018-10-27 MED ORDER — CIPROFLOXACIN HCL 500 MG PO TABS
500.0000 mg | ORAL_TABLET | ORAL | Status: AC
  Administered 2018-10-27: 500 mg via ORAL
  Filled 2018-10-27: qty 1

## 2018-10-27 MED ORDER — SODIUM CHLORIDE 0.9 % IV SOLN
INTRAVENOUS | Status: DC
  Administered 2018-10-27: 14:00:00 via INTRAVENOUS

## 2018-10-27 MED ORDER — DIPHENHYDRAMINE HCL 25 MG PO CAPS
25.0000 mg | ORAL_CAPSULE | ORAL | Status: AC
  Administered 2018-10-27: 25 mg via ORAL
  Filled 2018-10-27: qty 1

## 2018-10-27 MED ORDER — OXYCODONE-ACETAMINOPHEN 5-325 MG PO TABS: 1.0000 | ORAL_TABLET | ORAL | 0 refills | Status: AC | PRN

## 2018-10-27 MED ORDER — TAMSULOSIN HCL 0.4 MG PO CAPS: 0.4000 mg | ORAL_CAPSULE | Freq: Every day | ORAL | 0 refills | Status: DC

## 2018-10-27 MED ORDER — DIAZEPAM 5 MG PO TABS
10.0000 mg | ORAL_TABLET | ORAL | Status: AC
  Administered 2018-10-27: 10 mg via ORAL
  Filled 2018-10-27: qty 2

## 2018-10-27 NOTE — Discharge Instructions (Signed)

## 2018-10-28 ENCOUNTER — Encounter (HOSPITAL_COMMUNITY): Payer: Self-pay | Admitting: Urology

## 2018-10-29 ENCOUNTER — Encounter: Payer: Self-pay | Admitting: Family Medicine

## 2018-12-27 ENCOUNTER — Emergency Department (HOSPITAL_COMMUNITY)
Admission: EM | Admit: 2018-12-27 | Discharge: 2018-12-27 | Disposition: A | Discharge status: Home / Self Care | Payer: BLUE CROSS/BLUE SHIELD | Attending: Emergency Medicine | Admitting: Emergency Medicine

## 2018-12-27 ENCOUNTER — Encounter (HOSPITAL_COMMUNITY): Payer: Self-pay

## 2018-12-27 ENCOUNTER — Emergency Department (HOSPITAL_COMMUNITY): Payer: BLUE CROSS/BLUE SHIELD

## 2018-12-27 ENCOUNTER — Other Ambulatory Visit: Payer: Self-pay

## 2018-12-27 DIAGNOSIS — F17210 Nicotine dependence, cigarettes, uncomplicated: Secondary | ICD-10-CM | POA: Insufficient documentation

## 2018-12-27 DIAGNOSIS — Z79899 Other long term (current) drug therapy: Secondary | ICD-10-CM | POA: Insufficient documentation

## 2018-12-27 DIAGNOSIS — R1032 Left lower quadrant pain: Secondary | ICD-10-CM | POA: Diagnosis not present

## 2018-12-27 DIAGNOSIS — R112 Nausea with vomiting, unspecified: Secondary | ICD-10-CM | POA: Diagnosis not present

## 2018-12-27 DIAGNOSIS — N201 Calculus of ureter: Secondary | ICD-10-CM | POA: Diagnosis not present

## 2018-12-27 DIAGNOSIS — N202 Calculus of kidney with calculus of ureter: Secondary | ICD-10-CM | POA: Diagnosis not present

## 2018-12-27 LAB — BASIC METABOLIC PANEL
Anion gap: 7 (ref 5–15)
BUN: 16 mg/dL (ref 6–20)
CHLORIDE: 102 mmol/L (ref 98–111)
CO2: 28 mmol/L (ref 22–32)
Calcium: 9.1 mg/dL (ref 8.9–10.3)
Creatinine, Ser: 1.26 mg/dL — ABNORMAL HIGH (ref 0.61–1.24)
GFR calc Af Amer: >60 mL/min (ref >60)
GFR calc non Af Amer: >60 mL/min (ref >60)
Glucose, Bld: 120 mg/dL — ABNORMAL HIGH (ref 70–99)
POTASSIUM: 3.9 mmol/L (ref 3.5–5.1)
Sodium: 137 mmol/L (ref 135–145)

## 2018-12-27 LAB — CBC WITH DIFFERENTIAL/PLATELET
Abs Immature Granulocytes: 0.12 10*3/uL — ABNORMAL HIGH (ref 0.00–0.07)
Basophils Absolute: 0.1 10*3/uL (ref 0.0–0.1)
Basophils Relative: 1 %
Eosinophils Absolute: 0.1 10*3/uL (ref 0.0–0.5)
Eosinophils Relative: 1 %
HEMATOCRIT: 46.2 % (ref 39.0–52.0)
Hemoglobin: 15.1 g/dL (ref 13.0–17.0)
IMMATURE GRANULOCYTES: 1 %
LYMPHS ABS: 2.8 10*3/uL (ref 0.7–4.0)
LYMPHS PCT: 16 %
MCH: 30.8 pg (ref 26.0–34.0)
MCHC: 32.7 g/dL (ref 30.0–36.0)
MCV: 94.1 fL (ref 80.0–100.0)
Monocytes Absolute: 1.1 10*3/uL — ABNORMAL HIGH (ref 0.1–1.0)
Monocytes Relative: 6 %
Neutro Abs: 12.9 10*3/uL — ABNORMAL HIGH (ref 1.7–7.7)
Neutrophils Relative %: 75 %
Platelets: 299 10*3/uL (ref 150–400)
RBC: 4.91 MIL/uL (ref 4.22–5.81)
RDW: 13 % (ref 11.5–15.5)
WBC: 17.1 10*3/uL — ABNORMAL HIGH (ref 4.0–10.5)
nRBC: 0 % (ref 0.0–0.2)

## 2018-12-27 LAB — URINALYSIS, ROUTINE W REFLEX MICROSCOPIC
Bacteria, UA: NONE SEEN
Bilirubin Urine: NEGATIVE
Glucose, UA: NEGATIVE mg/dL
Ketones, ur: NEGATIVE mg/dL
LEUKOCYTE UA: NEGATIVE
Nitrite: NEGATIVE
Protein, ur: 30 mg/dL — AB
RBC / HPF: >50 RBC/hpf — ABNORMAL HIGH (ref 0–5)
Specific Gravity, Urine: 1.023 (ref 1.005–1.030)
pH: 6 (ref 5.0–8.0)

## 2018-12-27 MED ORDER — HYDROMORPHONE HCL 1 MG/ML IJ SOLN
1.0000 mg | Freq: Once | INTRAMUSCULAR | Status: AC
  Administered 2018-12-27: 1 mg via INTRAVENOUS
  Filled 2018-12-27: qty 1

## 2018-12-27 MED ORDER — KETOROLAC TROMETHAMINE 30 MG/ML IJ SOLN
30.0000 mg | Freq: Once | INTRAMUSCULAR | Status: AC
  Administered 2018-12-27: 30 mg via INTRAVENOUS
  Filled 2018-12-27: qty 1

## 2018-12-27 MED ORDER — TAMSULOSIN HCL 0.4 MG PO CAPS: 0.4000 mg | ORAL_CAPSULE | Freq: Every day | ORAL | 0 refills | Status: DC

## 2018-12-27 MED ORDER — ONDANSETRON 4 MG PO TBDP: 4.0000 mg | ORAL_TABLET | Freq: Three times a day (TID) | ORAL | 0 refills | Status: DC | PRN

## 2018-12-27 MED ORDER — ONDANSETRON HCL 4 MG/2ML IJ SOLN
4.0000 mg | Freq: Once | INTRAMUSCULAR | Status: AC
  Administered 2018-12-27: 4 mg via INTRAVENOUS
  Filled 2018-12-27: qty 2

## 2018-12-27 NOTE — ED Triage Notes (Signed)
Pt states left sided flank pain. Pt has hx of kidney stones, and they've had to be "broken up" before.

## 2018-12-27 NOTE — ED Provider Notes (Signed)
[facility]-EMERGENCY DEPT Provider Note   CSN: [id number] Arrival date & time: 12/27/18  1004    History   Chief Complaint Chief Complaint  Patient presents with  . Flank Pain    HPI Larry House is a 43 y.o. male.     HPI Patient presents with kidney stones.  Has had years of stones.  Last episode was around 2 months ago when he needs a lithotripsy.  Pain began last night.  Left flank and left abdomen.  Feels like previous stones.  Has had nausea and vomiting.  No relief with oxycodone at home.  Has having difficulty urinating.  No fevers.  No diarrhea or constipation.  Previous calcium oxalate stones. Past Medical History:  Diagnosis Date  . GERD (gastroesophageal reflux disease)    24h pH probe FAILED 03/31/15.  Hx of erosive esophagitis on EGD 03/31/15  . History of alcohol abuse   . History of chest pain 2019   Cath 02/2018 (pt declined stress testing b/c he has little faith in them): entirely clean.  Marland Kitchen History of cocaine abuse (HCC)    s/p rehab 2001: no drug abuse since that time.  Marland Kitchen History of kidney stones   . Lumbar spondylosis    with mild spondylolisthesis L5 on S1.  Intermittent LBP with R leg radiculopathy.  . Organic erectile dysfunction   . Recurrent kidney stones    Ca++ oxalate.  Flomax helps pass.  ++Recurrent--has seen urologist in remote past.  . Seasonal allergic rhinitis   . Tobacco dependence   . Ureterolithiasis 10/2018   ESWL planned as of 10/24/2018 urol o/v.    Patient Active Problem List   Diagnosis Date Noted  . Precordial chest pain   . Family history of coronary artery disease 02/04/2018  . Tobacco abuse 02/04/2018    Past Surgical History:  Procedure Laterality Date  . BRAVO PH STUDY  03/31/2015   Failed 48 - hour Bravo pH probe - Dr. Cammy Brochure  . ESOPHAGEAL MANOMETRY  04/29/2015   Essentially normal esophageal manometry. - Dr. Cammy Brochure  . EXTRACORPOREAL SHOCK WAVE LITHOTRIPSY Left 10/27/2018   Procedure: EXTRACORPOREAL SHOCK WAVE LITHOTRIPSY (ESWL);  Surgeon: Malen Gauze, MD;  Location: [facility] ORS;  Service: Urology;  Laterality: Left;  . LEFT HEART CATH AND CORONARY ANGIOGRAPHY N/A 02/07/2018   No CAD.  EF normal.  Procedure: LEFT HEART CATH AND CORONARY ANGIOGRAPHY;  Surgeon: Corky Crafts, MD;  Location: [facility] INVASIVE CV LAB;  Service: Cardiovascular;  Laterality: N/A;  . UPPER GI ENDOSCOPY  12/29/2014   LA Grade A reflus esophagitis, otherwise normal. - Dr. Cammy Brochure        Home Medications    Prior to Admission medications   Medication Sig Start Date End Date Taking? Authorizing Provider  esomeprazole (NEXIUM) 40 MG capsule TAKE ONE CAPSULE BY MOUTH DAILY AT 12: 00 PM Patient taking differently: Take 40 mg by mouth daily. Do NOT crush or open capsule.    Call pharmacy for TUBE administration. 10/02/18  Yes McGowen, Maryjean Morn, MD  ibuprofen (ADVIL,MOTRIN) 200 MG tablet Take 400 mg by mouth every 8 (eight) hours as needed (for pain.).   Yes [provider]  oxyCODONE-acetaminophen (PERCOCET) 5-325 MG tablet Take 1 tablet by mouth every 4 (four) hours as needed for severe pain. Sent to pharmacy 10/27/18 10/27/19 Yes McKenzie, Mardene Celeste, MD  albuterol (PROAIR HFA) 108 (90 Base) MCG/ACT inhaler Inhale 2 puffs into the lungs every 6 (six) hours  as needed for wheezing or shortness of breath. 06/17/18 06/17/19  McGowen, Maryjean Morn, MD  azithromycin (ZITHROMAX) 250 MG tablet 2 tabs po qd x 1d, then 1 tab po qd x 4d Patient not taking: Reported on 12/27/2018 06/17/18   Jeoffrey Massed, MD  fluticasone (FLONASE) 50 MCG/ACT nasal spray Place 1 spray into both nostrils daily as needed for allergies.    [provider]  ondansetron (ZOFRAN-ODT) 4 MG disintegrating tablet Take 1 tablet (4 mg total) by mouth every 8 (eight) hours as needed for nausea or vomiting. 12/27/18   Benjiman Core, MD  predniSONE (DELTASONE) 20 MG tablet 2 tabs po qd x 5d, then 1 tab po qd x 5d  Patient not taking: Reported on 12/27/2018 06/17/18   Jeoffrey Massed, MD  tadalafil (CIALIS) 10 MG tablet Take 10 mg by mouth daily as needed for erectile dysfunction.    [provider]  tamsulosin (FLOMAX) 0.4 MG CAPS capsule Take 1 capsule (0.4 mg total) by mouth daily. 12/27/18   Benjiman Core, MD    Family History Family History  Problem Relation Age of Onset  . Arthritis Father   . Early death Father   . Heart attack Father   . Heart disease Father   . Hyperlipidemia Father   . Hypertension Father   . Early death Paternal Grandfather   . Hearing loss Paternal Grandfather   . Heart attack Paternal Grandfather   . Heart disease Paternal Grandfather   . Hyperlipidemia Paternal Grandfather   . Hypertension Paternal Grandfather     Social History Social History   Tobacco Use  . Smoking status: Current Every Day Smoker    Packs/day: 1.00    Years: 26.00    Pack years: 26.00    Types: Cigarettes  . Smokeless tobacco: Never Used  Substance Use Topics  . Alcohol use: Yes    Alcohol/week: 12.0 standard drinks    Types: 12 Cans of beer per week  . Drug use: No     Allergies   Chlorhexidine gluconate   Review of Systems Review of Systems  Constitutional: Positive for appetite change. Negative for fever.  HENT: Negative for congestion.   Cardiovascular: Negative for chest pain.  Gastrointestinal: Positive for nausea and vomiting.  Genitourinary: Positive for difficulty urinating and flank pain. Negative for penile pain and testicular pain.  Musculoskeletal: Negative for back pain.  Neurological: Negative for weakness.  Psychiatric/Behavioral: Negative for confusion.     Physical Exam Updated Vital Signs BP 135/86   Pulse 82   Temp 97.7 F (36.5 C) (Oral)   Resp 18   SpO2 98%   Physical Exam Vitals signs and nursing note reviewed.  Constitutional:      Comments: Patient appears uncomfortable.  HENT:     Head: Atraumatic.     Mouth/Throat:      Mouth: Mucous membranes are moist.  Eyes:     Extraocular Movements: Extraocular movements intact.  Neck:     Musculoskeletal: Neck supple.  Cardiovascular:     Rate and Rhythm: Regular rhythm.  Pulmonary:     Effort: Pulmonary effort is normal.  Abdominal:     Comments: Mild left lower abdominal tenderness.  No rebound or guarding.  No hernia palpated.  Genitourinary:    Comments: Some CVA tenderness on left. Musculoskeletal: Normal range of motion.  Skin:    General: Skin is warm.     Capillary Refill: Capillary refill takes less than 2 seconds.  Neurological:  Mental Status: He is oriented to person, place, and time.  Psychiatric:        Mood and Affect: Mood normal.      ED Treatments / Results  Labs (all labs ordered are listed, but only abnormal results are displayed) Labs Reviewed  BASIC METABOLIC PANEL - Abnormal; Notable for the following components:      Result Value   Glucose, Bld 120 (*)    Creatinine, Ser 1.26 (*)    All other components within normal limits  CBC WITH DIFFERENTIAL/PLATELET - Abnormal; Notable for the following components:   WBC 17.1 (*)    Neutro Abs 12.9 (*)    Monocytes Absolute 1.1 (*)    Abs Immature Granulocytes 0.12 (*)    All other components within normal limits  URINALYSIS, ROUTINE W REFLEX MICROSCOPIC - Abnormal; Notable for the following components:   Hgb urine dipstick LARGE (*)    Protein, ur 30 (*)    RBC / HPF >50 (*)    All other components within normal limits    EKG None  Radiology Dg Abdomen 1 View  Result Date: 12/27/2018 CLINICAL DATA:  Left flank pain since this morning. History of kidney stones. EXAM: ABDOMEN - 1 VIEW COMPARISON:  Renal ultrasound today as well as previous CT urogram 10/24/2018 FINDINGS: Bowel gas pattern is nonobstructive. 5 mm calcification projects over the lower pole right kidney compatible with known renal stone. Previously seen 7 mm stone at the left UVJ no longer evident.  Persistent calcific density projects over the distal left ureter likely distal ureteral stone seen on previous CT 10/24/2018. Several pelvic phleboliths. Remainder of the exam is unchanged. IMPRESSION: Nonobstructive bowel gas pattern. Right nephrolithiasis. 5 mm calcific density projects over the distal left ureter unchanged from previous exam likely distal ureteral stone as seen on previous CT. Previously seen 7 mm stone over the left UVJ no longer visualized. Electronically Signed   By: Elberta Fortis M.D.   On: 12/27/2018 11:42   US Renal  Result Date: 12/27/2018 CLINICAL DATA:  Left flank pain.  History of kidney stones. EXAM: RENAL / URINARY TRACT ULTRASOUND COMPLETE COMPARISON:  CT 10/24/2018 FINDINGS: Right Kidney: Renal measurements: 5.0 x 5.6 x 10.4 cm = volume: 151 mL . Echogenicity within normal limits. No mass or hydronephrosis visualized. Three right renal stones with the largest measuring 1 cm over the lower pole. Left Kidney: Renal measurements: 5.1 x 6.2 x 12.4 cm = volume: 206 mL. Echogenicity within normal limits. No mass or hydronephrosis visualized. 6 mm stone over the upper pole. Bladder: Appears normal for degree of bladder distention. IMPRESSION: Normal size kidneys with bilateral nephrolithiasis. No hydronephrosis. Electronically Signed   By: Elberta Fortis M.D.   On: 12/27/2018 11:31    Procedures Procedures (including critical care time)  Medications Ordered in ED Medications  ondansetron (ZOFRAN) injection 4 mg (4 mg Intravenous Given 12/27/18 1026)  HYDROmorphone (DILAUDID) injection 1 mg (1 mg Intravenous Given 12/27/18 1026)  HYDROmorphone (DILAUDID) injection 1 mg (1 mg Intravenous Given 12/27/18 1142)  ketorolac (TORADOL) 30 MG/ML injection 30 mg (30 mg Intravenous Given 12/27/18 1253)     Initial Impression / Assessment and Plan / ED Course  I have reviewed the triage vital signs and the nursing notes.  Pertinent labs & imaging results that were available during my  care of the patient were reviewed by me and considered in my medical decision making (see chart for details).        Patient  with left flank pain.  White count somewhat elevated but urine does not show infection.  Feels much better after treatment.  History of ureteral stones that gets likely what this is.  X-ray showed likely ureteral stone however there is not hydronephrosis on renal ultrasound.  However with patient feeling much better and patient has a urologist I think it is reasonable to discharge home.  Will treat symptomatically and give Flomax since he has had success with this in the past.  Final Clinical Impressions(s) / ED Diagnoses   Final diagnoses:  Left ureteral stone    ED Discharge Orders         Ordered    ondansetron (ZOFRAN-ODT) 4 MG disintegrating tablet  Every 8 hours PRN,   Status:  Discontinued     12/27/18 1442    tamsulosin (FLOMAX) 0.4 MG CAPS capsule  Daily     12/27/18 1442    ondansetron (ZOFRAN-ODT) 4 MG disintegrating tablet  Every 8 hours PRN     12/27/18 1443           Benjiman Core, MD 12/27/18 1544

## 2018-12-27 NOTE — ED Notes (Addendum)
Pt unable to urinate at this time.   Will try again later.

## 2018-12-27 NOTE — ED Notes (Signed)
Patient transported to X-ray 

## 2018-12-27 NOTE — ED Notes (Addendum)
US in process

## 2018-12-27 NOTE — ED Notes (Signed)
Pt and wife verbalizes understanding discharge orders.

## 2019-06-01 ENCOUNTER — Telehealth: Payer: Self-pay | Admitting: Family Medicine

## 2019-06-01 NOTE — Telephone Encounter (Signed)
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 He was exposed to that employee the week of 05/18/19.  His employer is requiring for him to be tested before coming back. He has been under quarantine per his employer.  Patient reports no symptoms.   Patient can be reached at [phone].  Thank you

## 2019-06-01 NOTE — Telephone Encounter (Signed)
My Chart message sent with testing information

## 2019-06-01 NOTE — Telephone Encounter (Signed)
MyChart message read.

## 2019-10-23 ENCOUNTER — Other Ambulatory Visit: Payer: Self-pay

## 2019-10-26 ENCOUNTER — Other Ambulatory Visit: Payer: Self-pay

## 2019-10-26 ENCOUNTER — Ambulatory Visit (INDEPENDENT_AMBULATORY_CARE_PROVIDER_SITE_OTHER): Payer: BC Managed Care – PPO | Admitting: Family Medicine

## 2019-10-26 ENCOUNTER — Encounter: Payer: Self-pay | Admitting: Family Medicine

## 2019-10-26 VITALS — BP 119/83 | HR 72 | Temp 98.7°F | Resp 16 | Ht 69.5 in | Wt 207.2 lb

## 2019-10-26 DIAGNOSIS — E669 Obesity, unspecified: Secondary | ICD-10-CM

## 2019-10-26 DIAGNOSIS — S56419A Strain of extensor muscle, fascia and tendon of finger, unspecified finger at forearm level, initial encounter: Secondary | ICD-10-CM | POA: Diagnosis not present

## 2019-10-26 DIAGNOSIS — S6991XA Unspecified injury of right wrist, hand and finger(s), initial encounter: Secondary | ICD-10-CM

## 2019-10-26 DIAGNOSIS — Z0001 Encounter for general adult medical examination with abnormal findings: Secondary | ICD-10-CM | POA: Diagnosis not present

## 2019-10-26 MED ORDER — ESOMEPRAZOLE MAGNESIUM 40 MG PO CPDR: DELAYED_RELEASE_CAPSULE | ORAL | 3 refills | Status: DC

## 2019-10-26 MED ORDER — TADALAFIL 10 MG PO TABS: 10.0000 mg | ORAL_TABLET | Freq: Every day | ORAL | 6 refills | Status: DC | PRN

## 2019-10-26 NOTE — Progress Notes (Signed)
Office Note 10/26/2019  CC:  Chief Complaint  Patient presents with  . Annual Exam    pt is not fasting    HPI:  Larry House is a 44 y.o. male who is here for annual health maintenance exam. I haven't seen him since 06/2018.  Still smoking, thinking about quitting, in contemplation phase at this time. Tried chantix in the past and it had too many psych side effects. Wellbutrin helped in the past.  No exercise. Diet is "okay".  Traveling some, hard to eat good sometimes.  3 weeks ago he smashed his finger in wood pile, initially very painful and swollen and tender. All pain gone now, just some slight swelling over DIP joint and inability to extend finger at that joint.  PMP AWARE reviewed today: most recent rx for percocet was filled 10/27/18, # 30, rx by Dr. Ronne Binning in urology (kidney stones). No red flags.   Past Medical History:  Diagnosis Date  . GERD (gastroesophageal reflux disease)    24h pH probe FAILED 03/31/15.  Hx of erosive esophagitis on EGD 03/31/15  . History of alcohol abuse   . History of chest pain 2019   Cath 02/2018 (pt declined stress testing b/c he has little faith in them): entirely clean.  Marland Kitchen History of cocaine abuse (HCC)    s/p rehab 2001: no drug abuse since that time.  Marland Kitchen History of kidney stones   . Lumbar spondylosis    with mild spondylolisthesis L5 on S1.  Intermittent LBP with R leg radiculopathy.  . Organic erectile dysfunction   . Recurrent kidney stones    Ca++ oxalate.  Flomax helps pass.  ++Recurrent--has seen urologist in remote past.  . Seasonal allergic rhinitis   . Tobacco dependence   . Ureterolithiasis 10/2018   ESWL planned as of 10/24/2018 urol o/v.    Past Surgical History:  Procedure Laterality Date  . BRAVO PH STUDY  03/31/2015   Failed 48 - hour Bravo pH probe - Dr. Cammy Brochure  . ESOPHAGEAL MANOMETRY  04/29/2015   Essentially normal esophageal manometry. - Dr. Cammy Brochure  . EXTRACORPOREAL SHOCK WAVE  LITHOTRIPSY Left 10/27/2018   Procedure: EXTRACORPOREAL SHOCK WAVE LITHOTRIPSY (ESWL);  Surgeon: Malen Gauze, MD;  Location: [facility] ORS;  Service: Urology;  Laterality: Left;  . LEFT HEART CATH AND CORONARY ANGIOGRAPHY N/A 02/07/2018   No CAD.  EF normal.  Procedure: LEFT HEART CATH AND CORONARY ANGIOGRAPHY;  Surgeon: Corky Crafts, MD;  Location: [facility] INVASIVE CV LAB;  Service: Cardiovascular;  Laterality: N/A;  . UPPER GI ENDOSCOPY  12/29/2014   LA Grade A reflus esophagitis, otherwise normal. - Dr. Cammy Brochure    Family History  Problem Relation Age of Onset  . Arthritis Father   . Early death Father   . Heart attack Father   . Heart disease Father   . Hyperlipidemia Father   . Hypertension Father   . Early death Paternal Grandfather   . Hearing loss Paternal Grandfather   . Heart attack Paternal Grandfather   . Heart disease Paternal Grandfather   . Hyperlipidemia Paternal Grandfather   . Hypertension Paternal Grandfather     Social History   Socioeconomic History  . Marital status: Married    Spouse name: Not on file  . Number of children: Not on file  . Years of education: Not on file  . Highest education level: Not on file  Occupational History  . Not on file  Tobacco Use  .  Smoking status: Current Every Day Smoker    Packs/day: 1.00    Years: 26.00    Pack years: 26.00    Types: Cigarettes  . Smokeless tobacco: Never Used  Substance and Sexual Activity  . Alcohol use: Yes    Alcohol/week: 12.0 standard drinks    Types: 12 Cans of beer per week  . Drug use: No  . Sexual activity: Not on file  Other Topics Concern  . Not on file  Social History Narrative   Married, no children.   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   Tob:25 pack-yr hx as of 11/2017.   Alc: 12 pack/week.  Denies hx of abuse.   Drugs:hx of street drug abuse in 71s, jail for a brief period, rehab.     Clean since 2001.   Social Determinants of Health   Financial Resource  Strain:   . Difficulty of Paying Living Expenses: Not on file  Food Insecurity:   . Worried About Charity fundraiser in the Last Year: Not on file  . Ran Out of Food in the Last Year: Not on file  Transportation Needs:   . Lack of Transportation (Medical): Not on file  . Lack of Transportation (Non-Medical): Not on file  Physical Activity:   . Days of Exercise per Week: Not on file  . Minutes of Exercise per Session: Not on file  Stress:   . Feeling of Stress : Not on file  Social Connections:   . Frequency of Communication with Friends and Family: Not on file  . Frequency of Social Gatherings with Friends and Family: Not on file  . Attends Religious Services: Not on file  . Active Member of Clubs or Organizations: Not on file  . Attends Archivist Meetings: Not on file  . Marital Status: Not on file  Intimate Partner Violence:   . Fear of Current or Ex-Partner: Not on file  . Emotionally Abused: Not on file  . Physically Abused: Not on file  . Sexually Abused: Not on file    Outpatient Medications Prior to Visit  Medication Sig Dispense Refill  . ibuprofen (ADVIL,MOTRIN) 200 MG tablet Take 400 mg by mouth every 8 (eight) hours as needed (for pain.).    Marland Kitchen tamsulosin (FLOMAX) 0.4 MG CAPS capsule Take 1 capsule (0.4 mg total) by mouth daily. 10 capsule 0  . esomeprazole (NEXIUM) 40 MG capsule TAKE ONE CAPSULE BY MOUTH DAILY AT 12: 00 PM (Patient taking differently: Take 40 mg by mouth daily. Do NOT crush or open capsule.    Call pharmacy for TUBE administration.) 90 capsule 3  . fluticasone (FLONASE) 50 MCG/ACT nasal spray Place 1 spray into both nostrils daily as needed for allergies.    Marland Kitchen oxyCODONE-acetaminophen (PERCOCET) 5-325 MG tablet Take 1 tablet by mouth every 4 (four) hours as needed for severe pain. Sent to pharmacy (Patient not taking: Reported on 10/26/2019) 30 tablet 0  . albuterol (PROAIR HFA) 108 (90 Base) MCG/ACT inhaler Inhale 2 puffs into the lungs every 6  (six) hours as needed for wheezing or shortness of breath. (Patient not taking: Reported on 10/26/2019) 1 Inhaler 1  . azithromycin (ZITHROMAX) 250 MG tablet 2 tabs po qd x 1d, then 1 tab po qd x 4d (Patient not taking: Reported on 12/27/2018) 6 tablet 0  . ondansetron (ZOFRAN-ODT) 4 MG disintegrating tablet Take 1 tablet (4 mg total) by mouth every 8 (eight) hours as needed for nausea or vomiting. (  Patient not taking: Reported on 10/26/2019) 8 tablet 0  . predniSONE (DELTASONE) 20 MG tablet 2 tabs po qd x 5d, then 1 tab po qd x 5d (Patient not taking: Reported on 12/27/2018) 15 tablet 0  . tadalafil (CIALIS) 10 MG tablet Take 10 mg by mouth daily as needed for erectile dysfunction.     No facility-administered medications prior to visit.    Allergies  Allergen Reactions  . Chlorhexidine Gluconate Itching    Skin became reddened and itchy.    ROS Review of Systems  Constitutional: Negative for appetite change, chills, fatigue and fever.  HENT: Negative for congestion, dental problem, ear pain and sore throat.   Eyes: Negative for discharge, redness and visual disturbance.  Respiratory: Negative for cough, chest tightness, shortness of breath and wheezing.   Cardiovascular: Negative for chest pain, palpitations and leg swelling.  Gastrointestinal: Negative for abdominal pain, blood in stool, diarrhea, nausea and vomiting.  Genitourinary: Negative for difficulty urinating, dysuria, flank pain, frequency, hematuria and urgency.  Musculoskeletal: Positive for joint swelling (R DIP). Negative for arthralgias, back pain, myalgias and neck stiffness.  Skin: Negative for pallor and rash.  Neurological: Negative for dizziness, speech difficulty, weakness and headaches.  Hematological: Negative for adenopathy. Does not bruise/bleed easily.  Psychiatric/Behavioral: Negative for confusion and sleep disturbance. The patient is not nervous/anxious.     PE; Blood pressure 119/83, pulse 72, temperature  98.7 F (37.1 C), temperature source Temporal, resp. rate 16, height 5' 9.5" (1.765 m), weight 207 lb 3.2 oz (94 kg), SpO2 98 %. Body mass index is 30.16 kg/m.  Gen: Alert, well appearing.  Patient is oriented to person, place, time, and situation. AFFECT: pleasant, lucid thought and speech. ENT: Ears: EACs clear, normal epithelium.  TMs with good light reflex and landmarks bilaterally.  Eyes: no injection, icteris, swelling, or exudate.  EOMI, PERRLA. Nose: no drainage or turbinate edema/swelling.  No injection or focal lesion.  Mouth: lips without lesion/swelling.  Oral mucosa pink and moist.  Dentition intact and without obvious caries or gingival swelling.  Oropharynx without erythema, exudate, or swelling.  Neck: supple/nontender.  No LAD, mass, or TM.  Carotid pulses 2+ bilaterally, without bruits. CV: RRR, no m/r/g.   LUNGS: CTA bilat, nonlabored resps, good aeration in all lung fields. ABD: soft, NT, ND, BS normal.  No hepatospenomegaly or mass.  No bruits. EXT: no clubbing, cyanosis, or edema.  Musculoskeletal: no joint swelling, erythema, warmth, or tenderness.  ROM of all joints intact. Right middle finger with non-tender soft tissue swelling over dorsal aspect of DIP, with complete lack of ability to extend distal phalanx.  Nail intact. Skin - no sores or suspicious lesions or rashes or color changes   Pertinent labs:  No results found for: TSH Lab Results  Component Value Date   WBC 17.1 (H) 12/27/2018   HGB 15.1 12/27/2018   HCT 46.2 12/27/2018   MCV 94.1 12/27/2018   PLT 299 12/27/2018   Lab Results  Component Value Date   CREATININE 1.26 (H) 12/27/2018   BUN 16 12/27/2018   NA 137 12/27/2018   K 3.9 12/27/2018   CL 102 12/27/2018   CO2 28 12/27/2018   Lab Results  Component Value Date   ALT 22 03/19/2017   AST 27 03/19/2017   ALKPHOS 83 03/19/2017    ASSESSMENT AND PLAN:   1) Health maintenance exam: Reviewed age and gender appropriate health maintenance  issues (prudent diet, regular exercise, health risks of tobacco and excessive  alcohol, use of seatbelts, fire alarms in home, use of sunscreen).  Also reviewed age and gender appropriate health screening as well as vaccine recommendations. Vaccines: flu Vaccine UTD.  He declined Tdap today. Labs: fasting HP labs ordered->future when fasting. Prostate ca screening: average risk patient= as per latest guidelines, start screening at 2 yrs of age. Colon ca screening: average risk patient= as per latest guidelines, start screening at 18 yrs of age.  2) Tob dependence: contemplating quit attempt. He'll call when ready and we'll get him on wellbutrin xl which has helped him in the past.  3) Right hand middle finger injury: suspect extensor tendon tear/rupture.  Refer to orthopedic hand specialist. An After Visit Summary was printed and given to the patient.  ED and GERD: RF'd nexium and cialis today.  FOLLOW UP:  Return for fasting lab appt at his earliest convenience.  Office f/u for CPE 1 yr.  Signed:  Santiago Bumpers, MD           10/26/2019

## 2019-10-26 NOTE — Patient Instructions (Signed)

## 2019-10-27 ENCOUNTER — Encounter: Payer: Self-pay | Admitting: Family Medicine

## 2019-10-28 ENCOUNTER — Telehealth: Payer: Self-pay

## 2019-10-28 NOTE — Telephone Encounter (Signed)
PA sent via covermymed on 10/28/19.   Key: [id number]   Medication: TADALAFIL 10MG     Dx: ED   Per Dr. pt has tried and failed n/a   Waiting for response.    PA approved on 10/28/19. Will notify pharmacy.

## 2019-10-29 ENCOUNTER — Ambulatory Visit (INDEPENDENT_AMBULATORY_CARE_PROVIDER_SITE_OTHER): Payer: BC Managed Care – PPO | Admitting: Family Medicine

## 2019-10-29 ENCOUNTER — Other Ambulatory Visit: Payer: Self-pay

## 2019-10-29 DIAGNOSIS — E669 Obesity, unspecified: Secondary | ICD-10-CM

## 2019-10-29 LAB — CBC WITH DIFFERENTIAL/PLATELET
Basophils Absolute: 0.1 10*3/uL (ref 0.0–0.1)
Basophils Relative: 1 % (ref 0.0–3.0)
Eosinophils Absolute: 0.4 10*3/uL (ref 0.0–0.7)
Eosinophils Relative: 3.2 % (ref 0.0–5.0)
HCT: 46.9 % (ref 39.0–52.0)
Hemoglobin: 15.6 g/dL (ref 13.0–17.0)
Lymphocytes Relative: 32.8 % (ref 12.0–46.0)
Lymphs Abs: 3.7 10*3/uL (ref 0.7–4.0)
MCHC: 33.3 g/dL (ref 30.0–36.0)
MCV: 94.1 fl (ref 78.0–100.0)
Monocytes Absolute: 0.7 10*3/uL (ref 0.1–1.0)
Monocytes Relative: 5.9 % (ref 3.0–12.0)
Neutro Abs: 6.4 10*3/uL (ref 1.4–7.7)
Neutrophils Relative %: 57.1 % (ref 43.0–77.0)
Platelets: 299 10*3/uL (ref 150.0–400.0)
RBC: 4.98 Mil/uL (ref 4.22–5.81)
RDW: 13.4 % (ref 11.5–15.5)
WBC: 11.3 10*3/uL — ABNORMAL HIGH (ref 4.0–10.5)

## 2019-10-29 LAB — COMPREHENSIVE METABOLIC PANEL
ALT: 30 U/L (ref 0–53)
AST: 23 U/L (ref 0–37)
Albumin: 4.6 g/dL (ref 3.5–5.2)
Alkaline Phosphatase: 119 U/L — ABNORMAL HIGH (ref 39–117)
BUN: 15 mg/dL (ref 6–23)
CO2: 30 mEq/L (ref 19–32)
Calcium: 9.5 mg/dL (ref 8.4–10.5)
Chloride: 105 mEq/L (ref 96–112)
Creatinine, Ser: 0.98 mg/dL (ref 0.40–1.50)
GFR: 84.14 mL/min (ref >60.00)
Glucose, Bld: 85 mg/dL (ref 70–99)
Potassium: 4.7 mEq/L (ref 3.5–5.1)
Sodium: 142 mEq/L (ref 135–145)
Total Bilirubin: 0.3 mg/dL (ref 0.2–1.2)
Total Protein: 6.8 g/dL (ref 6.0–8.3)

## 2019-10-29 LAB — LIPID PANEL
Cholesterol: 207 mg/dL — ABNORMAL HIGH (ref 0–200)
HDL: 48.2 mg/dL (ref >39.00)
NonHDL: 158.49
Total CHOL/HDL Ratio: 4
Triglycerides: 201 mg/dL — ABNORMAL HIGH (ref 0.0–149.0)
VLDL: 40.2 mg/dL — ABNORMAL HIGH (ref 0.0–40.0)

## 2019-10-29 LAB — TSH: TSH: 1.65 u[IU]/mL (ref 0.35–4.50)

## 2019-10-29 LAB — LDL CHOLESTEROL, DIRECT: Direct LDL: 121 mg/dL

## 2019-11-10 DIAGNOSIS — M79644 Pain in right finger(s): Secondary | ICD-10-CM | POA: Diagnosis not present

## 2019-11-10 DIAGNOSIS — S62632A Displaced fracture of distal phalanx of right middle finger, initial encounter for closed fracture: Secondary | ICD-10-CM | POA: Diagnosis not present

## 2019-11-17 DIAGNOSIS — S62632D Displaced fracture of distal phalanx of right middle finger, subsequent encounter for fracture with routine healing: Secondary | ICD-10-CM | POA: Diagnosis not present

## 2019-11-25 DIAGNOSIS — S63632A Sprain of interphalangeal joint of right middle finger, initial encounter: Secondary | ICD-10-CM | POA: Diagnosis not present

## 2019-11-25 DIAGNOSIS — Y999 Unspecified external cause status: Secondary | ICD-10-CM | POA: Diagnosis not present

## 2019-11-25 DIAGNOSIS — X58XXXA Exposure to other specified factors, initial encounter: Secondary | ICD-10-CM | POA: Diagnosis not present

## 2019-11-25 DIAGNOSIS — S62632A Displaced fracture of distal phalanx of right middle finger, initial encounter for closed fracture: Secondary | ICD-10-CM | POA: Diagnosis not present

## 2019-11-25 IMAGING — US RENAL/URINARY TRACT ULTRASOUND
1 series · 14 of 25 positions shown · non-contrast
Comparison: CT 10/24/2018

CLINICAL DATA: Left flank pain.  History of kidney stones.

EXAM:
RENAL / URINARY TRACT ULTRASOUND COMPLETE

[Series 1: renal/urinary tract ultrasound · 14 of 80 slices shown]
[im 1/80]
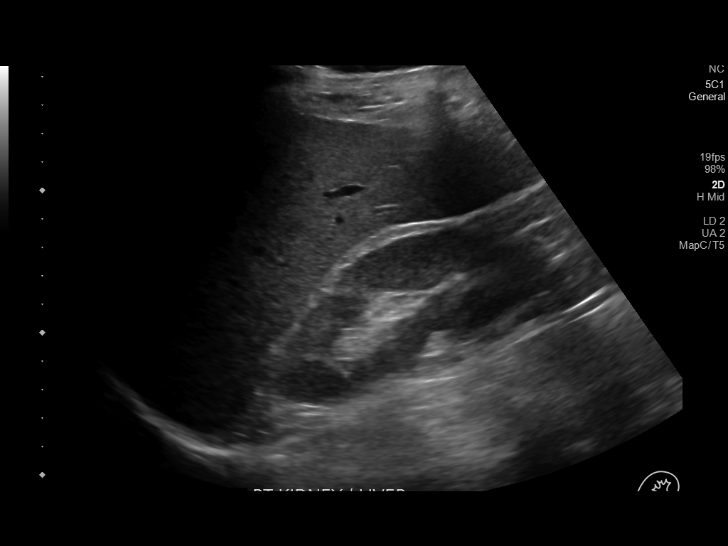
[im 7/80]
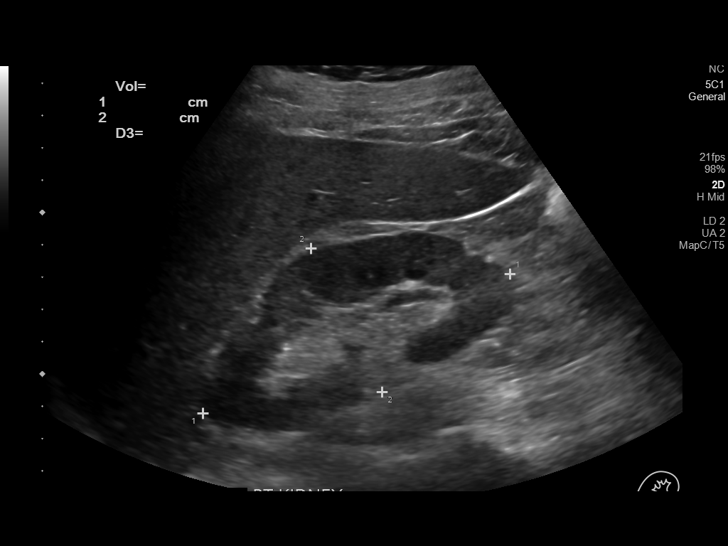
[im 14/80]
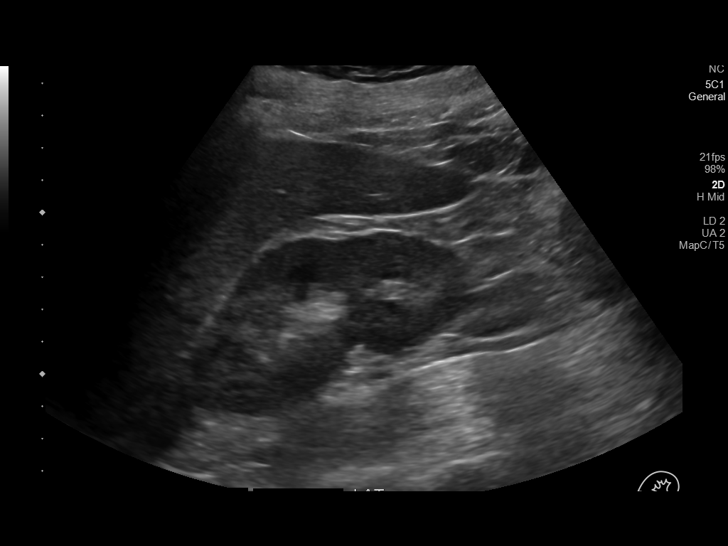
[im 20/80]
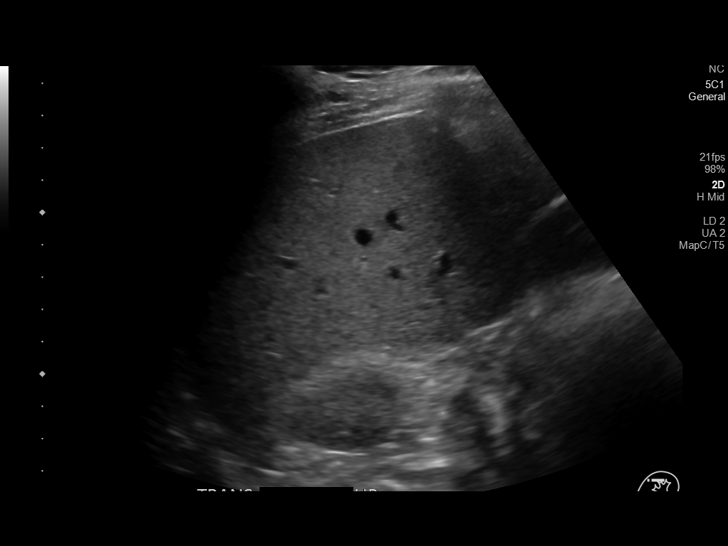
[im 27/80]
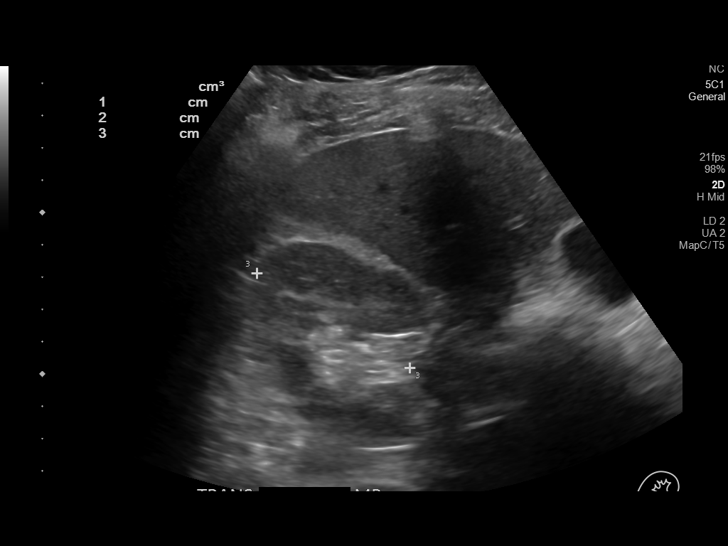
[im 30/80]
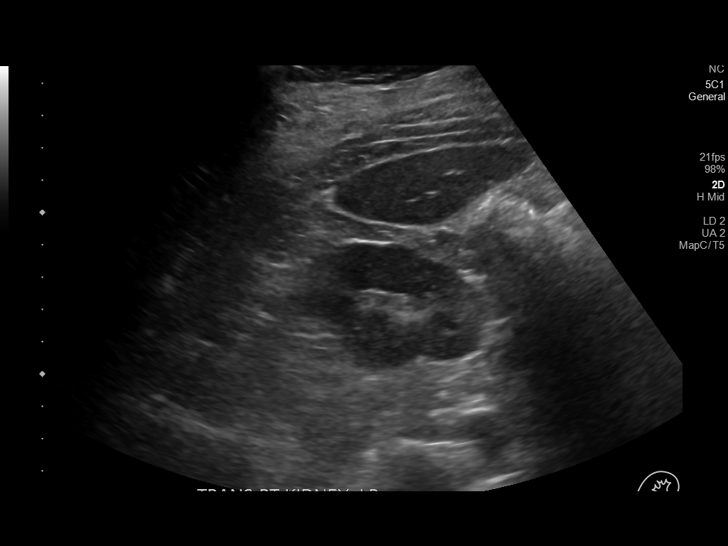
[im 37/80]
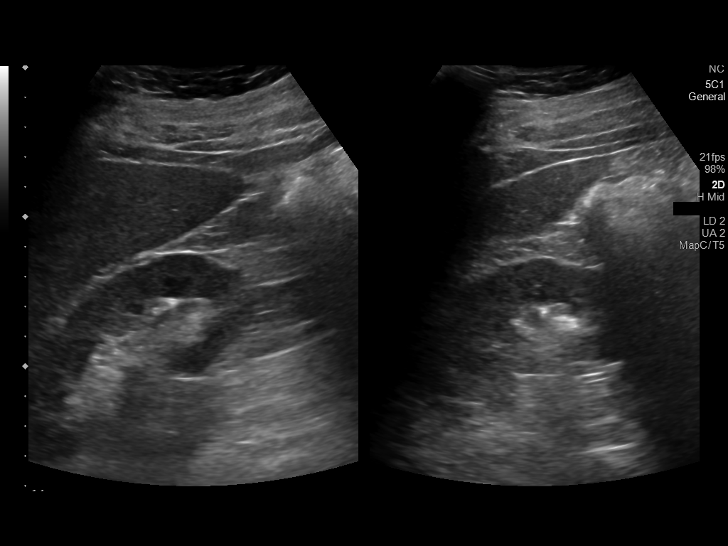
[im 43/80]
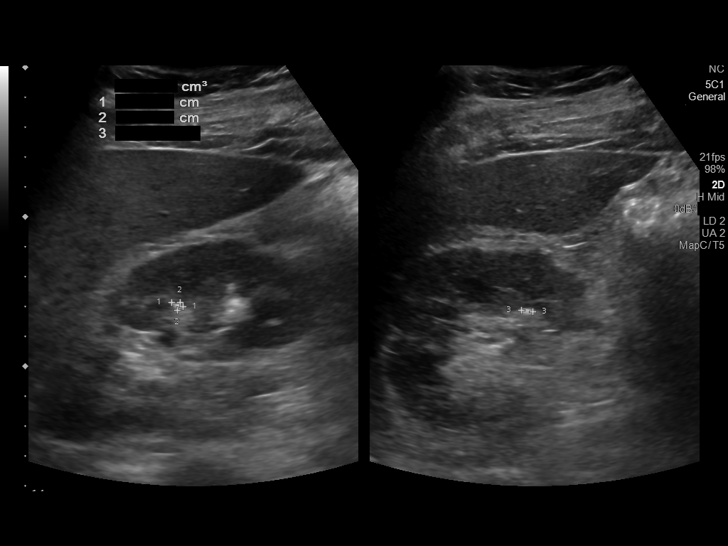
[im 50/80]
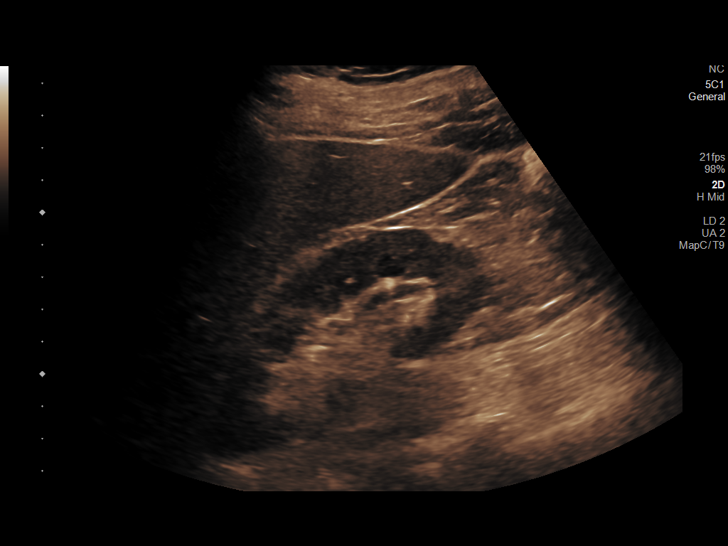
[im 53/80]
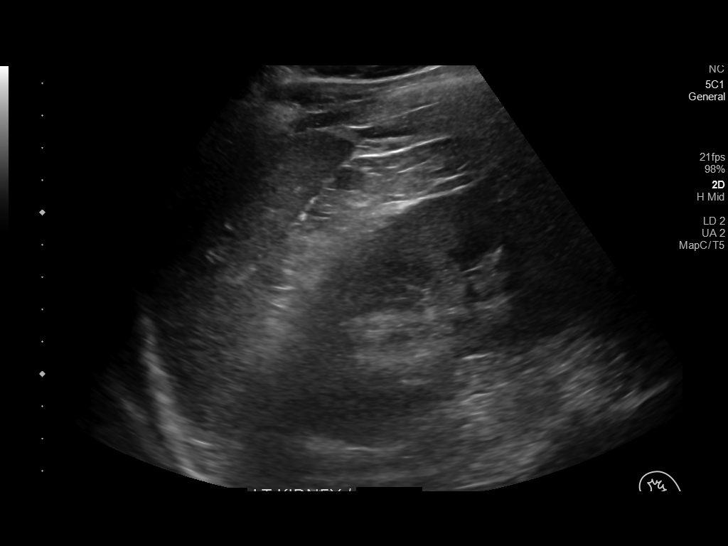
[im 60/80]
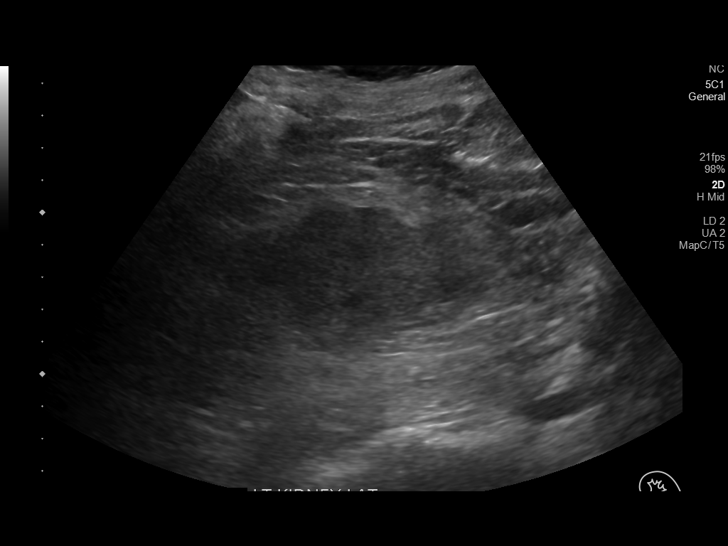
[im 66/80]
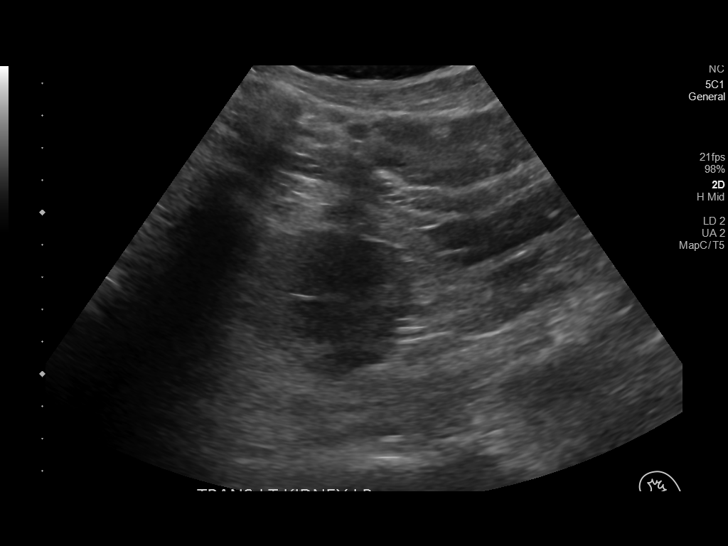
[im 73/80]
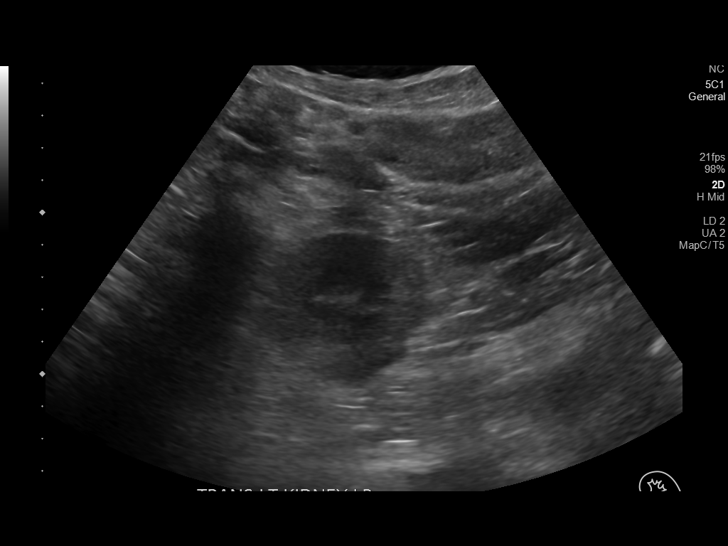
[im 80/80]
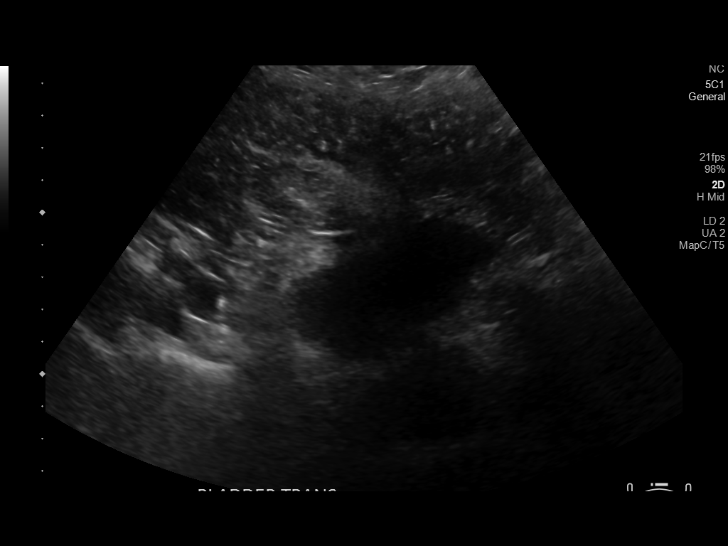

[14 of 25 positions shown; findings below may reference images not displayed]

FINDINGS: Right Kidney:

Renal measurements: 5.0 x 5.6 x 10.4 cm = volume: 151 mL .
Echogenicity within normal limits. No mass or hydronephrosis
visualized. Three right renal stones with the largest measuring 1 cm
over the lower pole.

Left Kidney:

Renal measurements: 5.1 x 6.2 x 12.4 cm = volume: 206 mL.
Echogenicity within normal limits. No mass or hydronephrosis
visualized. 6 mm stone over the upper pole.

Bladder:

Appears normal for degree of bladder distention.
IMPRESSION: Normal size kidneys with bilateral nephrolithiasis. No
hydronephrosis.

## 2019-12-03 DIAGNOSIS — S62632D Displaced fracture of distal phalanx of right middle finger, subsequent encounter for fracture with routine healing: Secondary | ICD-10-CM | POA: Diagnosis not present

## 2020-01-04 ENCOUNTER — Other Ambulatory Visit: Payer: Self-pay

## 2020-01-04 ENCOUNTER — Ambulatory Visit: Payer: BC Managed Care – PPO | Attending: Internal Medicine

## 2020-01-04 DIAGNOSIS — Z23 Encounter for immunization: Secondary | ICD-10-CM

## 2020-01-04 MED ORDER — FLUTICASONE PROPIONATE 50 MCG/ACT NA SUSP: 2.0000 | Freq: Every day | NASAL | 6 refills | Status: DC | PRN

## 2020-01-04 NOTE — Progress Notes (Signed)
   Covid-19 Vaccination Clinic  Name:  Larry House    MRN: [id number] DOB: [birth year]  01/04/2020  [name] was observed post Covid-19 immunization for 15 minutes without incident. He was provided with Vaccine Information Sheet and instruction to access the V-Safe system.   [name] was instructed to call 911 with any severe reactions post vaccine: Marland Kitchen Difficulty breathing  . Swelling of face and throat  . A fast heartbeat  . A bad rash all over body  . Dizziness and weakness   Immunizations Administered    Name Date Dose VIS Date Route   Pfizer COVID-19 Vaccine 01/04/2020  1:34 PM 0.3 mL 09/18/2019 Intramuscular   Manufacturer: [employer], [employer]   Lot: [id number]   NDC: [address]-1

## 2020-01-04 NOTE — Telephone Encounter (Signed)
RF request for Flumethasone  LOV:10/26/19 Next ov: advised to f/u 1 year Last written: n/a  Medication never prescribed by you, currently pending approval.

## 2020-01-04 NOTE — Telephone Encounter (Signed)
RF request for Fluticasone LOV:10/26/19 Next ov: advised to f/u 1 yr Last written:n/a  Medication not previously written by you, currently pending

## 2020-01-04 NOTE — Telephone Encounter (Signed)
Patient refill request  fluticasone (FLONASE) 50 MCG/ACT nasal spray [[id number]]    [id number] - [place], [place] - [address]

## 2020-01-05 NOTE — Telephone Encounter (Signed)
Rf sent

## 2020-01-07 DIAGNOSIS — S62632D Displaced fracture of distal phalanx of right middle finger, subsequent encounter for fracture with routine healing: Secondary | ICD-10-CM | POA: Diagnosis not present

## 2020-01-21 DIAGNOSIS — S62632D Displaced fracture of distal phalanx of right middle finger, subsequent encounter for fracture with routine healing: Secondary | ICD-10-CM | POA: Diagnosis not present

## 2020-01-26 ENCOUNTER — Ambulatory Visit: Payer: BC Managed Care – PPO | Attending: Internal Medicine

## 2020-01-26 DIAGNOSIS — Z23 Encounter for immunization: Secondary | ICD-10-CM

## 2020-01-26 NOTE — Progress Notes (Signed)
   Covid-19 Vaccination Clinic  Name:  Larry House    MRN: [id number] DOB: December 18, 1975  01/26/2020  [name] was observed post Covid-19 immunization for 15 minutes without incident. He was provided with Vaccine Information Sheet and instruction to access the V-Safe system.   [name] was instructed to call 911 with any severe reactions post vaccine: Marland Kitchen Difficulty breathing  . Swelling of face and throat  . A fast heartbeat  . A bad rash all over body  . Dizziness and weakness   Immunizations Administered    Name Date Dose VIS Date Route   Pfizer COVID-19 Vaccine 01/26/2020  1:55 PM 0.3 mL 12/02/2018 Intramuscular   Manufacturer: [employer], [employer]   Lot: [id number]   NDC: [address]-3

## 2020-02-02 DIAGNOSIS — S62632D Displaced fracture of distal phalanx of right middle finger, subsequent encounter for fracture with routine healing: Secondary | ICD-10-CM | POA: Diagnosis not present

## 2020-04-13 ENCOUNTER — Encounter: Payer: Self-pay | Admitting: Family Medicine

## 2020-04-14 ENCOUNTER — Other Ambulatory Visit: Payer: Self-pay

## 2020-04-14 MED ORDER — FLUTICASONE PROPIONATE 50 MCG/ACT NA SUSP: 2.0000 | Freq: Every day | NASAL | 3 refills | Status: DC | PRN

## 2020-04-14 MED ORDER — TADALAFIL 10 MG PO TABS: 10.0000 mg | ORAL_TABLET | Freq: Every day | ORAL | 3 refills | Status: DC | PRN

## 2020-05-11 ENCOUNTER — Other Ambulatory Visit: Payer: Self-pay

## 2020-05-11 MED ORDER — TADALAFIL 10 MG PO TABS: 10.0000 mg | ORAL_TABLET | Freq: Every day | ORAL | 1 refills | Status: DC | PRN

## 2020-05-25 ENCOUNTER — Other Ambulatory Visit: Payer: Self-pay

## 2020-05-25 MED ORDER — TADALAFIL 10 MG PO TABS: 10.0000 mg | ORAL_TABLET | Freq: Every day | ORAL | 1 refills | Status: DC | PRN

## 2020-05-25 MED ORDER — ESOMEPRAZOLE MAGNESIUM 40 MG PO CPDR: DELAYED_RELEASE_CAPSULE | ORAL | 3 refills | Status: DC

## 2020-10-23 ENCOUNTER — Encounter: Payer: Self-pay | Admitting: Family Medicine

## 2021-02-06 ENCOUNTER — Ambulatory Visit (INDEPENDENT_AMBULATORY_CARE_PROVIDER_SITE_OTHER): Payer: BC Managed Care – PPO | Admitting: Family Medicine

## 2021-02-06 ENCOUNTER — Telehealth: Payer: Self-pay

## 2021-02-06 ENCOUNTER — Encounter: Payer: Self-pay | Admitting: Family Medicine

## 2021-02-06 ENCOUNTER — Other Ambulatory Visit: Payer: Self-pay

## 2021-02-06 VITALS — BP 121/81 | HR 79 | Temp 97.8°F | Resp 16 | Ht 69.0 in | Wt 184.0 lb

## 2021-02-06 DIAGNOSIS — J301 Allergic rhinitis due to pollen: Secondary | ICD-10-CM | POA: Diagnosis not present

## 2021-02-06 DIAGNOSIS — Z0000 Encounter for general adult medical examination without abnormal findings: Secondary | ICD-10-CM

## 2021-02-06 DIAGNOSIS — N529 Male erectile dysfunction, unspecified: Secondary | ICD-10-CM | POA: Diagnosis not present

## 2021-02-06 DIAGNOSIS — K219 Gastro-esophageal reflux disease without esophagitis: Secondary | ICD-10-CM | POA: Diagnosis not present

## 2021-02-06 MED ORDER — TAMSULOSIN HCL 0.4 MG PO CAPS: 0.4000 mg | ORAL_CAPSULE | Freq: Every day | ORAL | 2 refills | Status: DC

## 2021-02-06 MED ORDER — ESOMEPRAZOLE MAGNESIUM 40 MG PO CPDR: DELAYED_RELEASE_CAPSULE | ORAL | 3 refills | Status: DC

## 2021-02-06 MED ORDER — AZELASTINE-FLUTICASONE 137-50 MCG/ACT NA SUSP: 1.0000 | Freq: Two times a day (BID) | NASAL | 11 refills | Status: DC

## 2021-02-06 MED ORDER — TADALAFIL 10 MG PO TABS: 10.0000 mg | ORAL_TABLET | Freq: Every day | ORAL | 3 refills | Status: DC | PRN

## 2021-02-06 NOTE — Progress Notes (Signed)
Office Note 02/06/2021  CC:  Chief Complaint  Patient presents with  . Annual Exam    Not fasting    HPI:  Larry House is a 45 y.o. male who is here for annual health maintenance exam and f/u GERD and tobacco dependence. Feeling well Work stress high as usual, has taken up gardening for stress relief. Still smoking but he has made some good healthy diet changes over the last year and "wants to take one thing at a time".  GERD: responds well to daily nexium 40mg .  Allergic rhin: says flonase not as helpful as is used to be. For > 23mo has had lots of sneezing, irritated throat, some PND cough.  Covid test neg at beginning of this.  No wheezing or SOB.  Some pressure in head initially but not anymore. Uses neti pot regularly and it helps.  Tried xyzal but felt no diff.   Past Medical History:  Diagnosis Date  . GERD (gastroesophageal reflux disease)    24h pH probe FAILED 03/31/15.  Hx of erosive esophagitis on EGD 03/31/15  . History of alcohol abuse   . History of chest pain 2019   Cath 02/2018 (pt declined stress testing b/c he has little faith in them): entirely clean.  03/2018 History of cocaine abuse (HCC)    s/p rehab 2001: no drug abuse since that time.  2002 History of kidney stones   . Lumbar spondylosis    with mild spondylolisthesis L5 on S1.  Intermittent LBP with R leg radiculopathy.  . Organic erectile dysfunction   . Recurrent kidney stones    Ca++ oxalate.  Flomax helps pass.  ++Recurrent--has seen urologist in remote past.  . Seasonal allergic rhinitis   . Tobacco dependence   . Ureterolithiasis 10/2018   ESWL planned as of 10/24/2018 urol o/v.    Past Surgical History:  Procedure Laterality Date  . BRAVO PH STUDY  03/31/2015   Failed 48 - hour Bravo pH probe - Dr. 04/02/2015  . ESOPHAGEAL MANOMETRY  04/29/2015   Essentially normal esophageal manometry. - Dr. 05/01/2015  . EXTRACORPOREAL SHOCK WAVE LITHOTRIPSY Left 10/27/2018   Procedure: EXTRACORPOREAL  SHOCK WAVE LITHOTRIPSY (ESWL);  Surgeon: 10/29/2018, MD;  Location: [facility];  Service: Urology;  Laterality: Left;  . LEFT HEART CATH AND CORONARY ANGIOGRAPHY N/A 02/07/2018   No CAD.  EF normal.  Procedure: LEFT HEART CATH AND CORONARY ANGIOGRAPHY;  Surgeon: 04/09/2018, MD;  Location: [facility] INVASIVE CV LAB;  Service: Cardiovascular;  Laterality: N/A;  . UPPER GI ENDOSCOPY  12/29/2014   LA Grade A reflus esophagitis, otherwise normal. - Dr. 12/31/2014    Family History  Problem Relation Age of Onset  . Arthritis Father   . Early death Father   . Heart attack Father   . Heart disease Father   . Hyperlipidemia Father   . Hypertension Father   . Early death Paternal Grandfather   . Hearing loss Paternal Grandfather   . Heart attack Paternal Grandfather   . Heart disease Paternal Grandfather   . Hyperlipidemia Paternal Grandfather   . Hypertension Paternal Grandfather     Social History   Socioeconomic History  . Marital status: Married    Spouse name: Not on file  . Number of children: Not on file  . Years of education: Not on file  . Highest education level: Not on file  Occupational History  . Not on file  Tobacco Use  . Smoking  status: Current Every Day Smoker    Packs/day: 1.00    Years: 26.00    Pack years: 26.00    Types: Cigarettes  . Smokeless tobacco: Never Used  Vaping Use  . Vaping Use: Former  Substance and Sexual Activity  . Alcohol use: Yes    Alcohol/week: 12.0 standard drinks    Types: 12 Cans of beer per week  . Drug use: No  . Sexual activity: Not on file  Other Topics Concern  . Not on file  Social History Narrative   Married, no children.   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   Tob:25 pack-yr hx as of 11/2017.   Alc: 12 pack/week.  Denies hx of abuse.   Drugs:hx of street drug abuse in 63s, jail for a brief period, rehab.     Clean since 2001.   Social Determinants of Health   Financial Resource Strain: Not on file   Food Insecurity: Not on file  Transportation Needs: Not on file  Physical Activity: Not on file  Stress: Not on file  Social Connections: Not on file  Intimate Partner Violence: Not on file    Outpatient Medications Prior to Visit  Medication Sig Dispense Refill  . esomeprazole (NEXIUM) 40 MG capsule TAKE ONE CAPSULE BY MOUTH DAILY AT 12: 00 PM 90 capsule 3  . tadalafil (CIALIS) 10 MG tablet Take 1 tablet (10 mg total) by mouth daily as needed for erectile dysfunction. 90 tablet 1  . fluticasone (FLONASE) 50 MCG/ACT nasal spray Place 2 sprays into both nostrils daily as needed for allergies. (Patient not taking: Reported on 02/06/2021) 48 g 3  . ibuprofen (ADVIL,MOTRIN) 200 MG tablet Take 400 mg by mouth every 8 (eight) hours as needed (for pain.). (Patient not taking: Reported on 02/06/2021)    . tamsulosin (FLOMAX) 0.4 MG CAPS capsule Take 1 capsule (0.4 mg total) by mouth daily. (Patient not taking: Reported on 02/06/2021) 10 capsule 0   No facility-administered medications prior to visit.    Allergies  Allergen Reactions  . Chlorhexidine Gluconate Itching    Skin became reddened and itchy.   ROS Review of Systems  Constitutional: Negative for appetite change, chills, fatigue and fever.  HENT: Negative for congestion, dental problem, ear pain and sore throat.   Eyes: Negative for discharge, redness and visual disturbance.  Respiratory: Negative for cough, chest tightness, shortness of breath and wheezing.   Cardiovascular: Negative for chest pain, palpitations and leg swelling.  Gastrointestinal: Negative for abdominal pain, blood in stool, diarrhea, nausea and vomiting.  Genitourinary: Negative for difficulty urinating, dysuria, flank pain, frequency, hematuria and urgency.  Musculoskeletal: Negative for arthralgias, back pain, joint swelling, myalgias and neck stiffness.  Skin: Negative for pallor and rash.  Neurological: Negative for dizziness, speech difficulty, weakness and  headaches.  Hematological: Negative for adenopathy. Does not bruise/bleed easily.  Psychiatric/Behavioral: Negative for confusion and sleep disturbance. The patient is not nervous/anxious.     PE; Vitals with BMI 02/06/2021 10/26/2019 12/27/2018  Height 5\' 9"  5' 9.5" -  Weight 184 lbs 207 lbs 3 oz -  BMI 27.16 30.17 -  Systolic 121 119  Diastolic 81 83 86  Pulse 79 72 82   Gen: Alert, well appearing.  Patient is oriented to person, place, time, and situation. AFFECT: pleasant, lucid thought and speech. ENT: Ears: EACs clear, normal epithelium.  TMs with good light reflex and landmarks bilaterally.  Eyes: no injection, icteris, swelling, or exudate.  EOMI,  PERRLA. Nose: no drainage.  He has bilat nasal turbinate edema/swelling with mild injection.  No focal lesion.  Mouth: lips without lesion/swelling.  Oral mucosa pink and moist.  Dentition intact and without obvious caries or gingival swelling.  Oropharynx without erythema, exudate, or swelling.  No facial tenderness. Neck: supple/nontender.  No LAD, mass, or TM.  Carotid pulses 2+ bilaterally, without bruits. CV: RRR, no m/r/g.   LUNGS: CTA bilat, nonlabored resps, good aeration in all lung fields. ABD: soft, NT, ND, BS normal.  No hepatospenomegaly or mass.  No bruits. EXT: no clubbing, cyanosis, or edema.  Musculoskeletal: no joint swelling, erythema, warmth, or tenderness.  ROM of all joints intact. Skin - no sores or suspicious lesions or rashes or color changes   Pertinent labs:  Lab Results  Component Value Date   TSH 1.65 10/29/2019   Lab Results  Component Value Date   WBC 11.3 (H) 10/29/2019   HGB 15.6 10/29/2019   HCT 46.9 10/29/2019   MCV 94.1 10/29/2019   PLT 299.0 10/29/2019   Lab Results  Component Value Date   CREATININE 0.98 10/29/2019   BUN 15 10/29/2019   NA 142 10/29/2019   K 4.7 10/29/2019   CL 105 10/29/2019   CO2 30 10/29/2019   Lab Results  Component Value Date   ALT 30 10/29/2019   AST 23  10/29/2019   ALKPHOS 119 (H) 10/29/2019   BILITOT 0.3 10/29/2019   Lab Results  Component Value Date   CHOL 207 (H) 10/29/2019   Lab Results  Component Value Date   HDL 48.20 10/29/2019   No results found for: [facility] Lab Results  Component Value Date   TRIG 201.0 (H) 10/29/2019   Lab Results  Component Value Date   CHOLHDL 4 10/29/2019    ASSESSMENT AND PLAN:   1) Allergic rhinitis: not well controlled on flonase anymore. OTC oral antihistamine trial no help per pt. Will do trial of dymista 1 spray each nostril q12h. Cont neti pot regularly. Consider future add-on of singulair or switch to totally new nasal steroid spray.  2) GERD: stable on nexium 40mg  qd.  3) ED: good response and no adverse effects to cialis 10mg  qd prn.  RF'd today.  4) Health maintenance exam: Reviewed age and gender appropriate health maintenance issues (prudent diet, regular exercise, health risks of tobacco and excessive alcohol, use of seatbelts, fire alarms in home, use of sunscreen).  Also reviewed age and gender appropriate health screening as well as vaccine recommendations. Vaccines: Tdap booster due->I forgot to give this to him today. Labs: fasting labs to be done via his employer and he'll drop results off here.  If he doesn't get this done through them he'll call and request fasting lab appt and I'll do HP labs. Prostate ca screening: average risk patient= as per latest guidelines, start screening at 76 yrs of age. Colon ca screening: average risk patient= as per latest guidelines, start screening at 61 yrs of age.  Allergic rhinitis: failing flonase lately, no sign of acute sinusitis. Trial of dymista rx'd.  An After Visit Summary was printed and given to the patient.  FOLLOW UP:  No follow-ups on file.  Signed:  46, MD           02/06/2021

## 2021-02-06 NOTE — Telephone Encounter (Signed)
PA sent via covermymed on 02/06/21   Key: [id number]    Medication: Azelastine-Fluticasone 137-50MCG/ACT suspension  Dx: J30.1, Allergic rhinitis due to pollen   Per Dr. Milinda Cave pt has tried and failed generic fluticasone propionate   Waiting for response.   Approved today [id number];GXIVHS:[id number];Review Type:Prior Auth;Coverage Start Date:01/07/2021;Coverage End Date:02/06/2022;

## 2021-02-06 NOTE — Patient Instructions (Signed)

## 2021-03-22 DIAGNOSIS — K219 Gastro-esophageal reflux disease without esophagitis: Secondary | ICD-10-CM | POA: Diagnosis not present

## 2021-03-22 DIAGNOSIS — F411 Generalized anxiety disorder: Secondary | ICD-10-CM | POA: Diagnosis not present

## 2021-03-22 DIAGNOSIS — F17210 Nicotine dependence, cigarettes, uncomplicated: Secondary | ICD-10-CM | POA: Diagnosis not present

## 2021-03-22 DIAGNOSIS — F1020 Alcohol dependence, uncomplicated: Secondary | ICD-10-CM | POA: Diagnosis not present

## 2021-03-22 DIAGNOSIS — E569 Vitamin deficiency, unspecified: Secondary | ICD-10-CM | POA: Diagnosis not present

## 2021-03-22 DIAGNOSIS — F341 Dysthymic disorder: Secondary | ICD-10-CM | POA: Diagnosis not present

## 2021-03-22 DIAGNOSIS — F10239 Alcohol dependence with withdrawal, unspecified: Secondary | ICD-10-CM | POA: Diagnosis not present

## 2021-03-23 ENCOUNTER — Encounter: Payer: Self-pay | Admitting: Family Medicine

## 2021-03-23 ENCOUNTER — Telehealth: Payer: Self-pay

## 2021-03-23 ENCOUNTER — Ambulatory Visit: Payer: BC Managed Care – PPO | Admitting: Family Medicine

## 2021-03-23 NOTE — Telephone Encounter (Signed)
Pt's wife advised letter faxed.

## 2021-03-23 NOTE — Telephone Encounter (Signed)
OK, letter done and put on Britt's desk. Signed:  Santiago Bumpers, MD           03/23/2021

## 2021-03-23 NOTE — Telephone Encounter (Signed)
Wife Larry House [facility]) calling about patient. Patient voluntary admitted himself to Rehab Facility yesterday to detox himself from drinking and to seek help.  At this time not sure how long he will be there.  He was told a minium of at least 3-7 days or longer to detox all alcohol from his body. Facility is [facility] in [place]. They will not allow patient to take his daily medications without letter from his PCP.   Wife is calling to ask Dr. Milinda Cave to write letter so that he will be able to take his meds.  Letter must state name of medication and dosage.  Requesting letter today, if possible.  You can reach [place] at [phone].  She is requesting letter to be sent to her email.     [email or web address]   Azelastine-Fluticasone (DYMISTA) 137-50 MCG/ACT SUSP [[id number]]   esomeprazole (NEXIUM) 40 MG capsule [[id number]]   tamsulosin (FLOMAX) 0.4 MG CAPS capsule [[id number]]

## 2021-03-23 NOTE — Telephone Encounter (Signed)
Please see below and advise.

## 2021-03-23 NOTE — Telephone Encounter (Signed)
Letter sent to e-mail, left VM for pt's wife, [facility]) to inform of this.

## 2021-03-23 NOTE — Telephone Encounter (Signed)
Pt wife called back stating the facility will not accept the letter via email and asked if we can fax it to them.  Nurses fax # [phone]

## 2021-03-27 DIAGNOSIS — F1020 Alcohol dependence, uncomplicated: Secondary | ICD-10-CM | POA: Diagnosis not present

## 2021-03-28 DIAGNOSIS — F1020 Alcohol dependence, uncomplicated: Secondary | ICD-10-CM | POA: Diagnosis not present

## 2021-03-29 DIAGNOSIS — F1020 Alcohol dependence, uncomplicated: Secondary | ICD-10-CM | POA: Diagnosis not present

## 2021-03-30 DIAGNOSIS — F1020 Alcohol dependence, uncomplicated: Secondary | ICD-10-CM | POA: Diagnosis not present

## 2021-03-31 DIAGNOSIS — F1020 Alcohol dependence, uncomplicated: Secondary | ICD-10-CM | POA: Diagnosis not present

## 2021-04-03 DIAGNOSIS — F1020 Alcohol dependence, uncomplicated: Secondary | ICD-10-CM | POA: Diagnosis not present

## 2021-04-04 DIAGNOSIS — F1020 Alcohol dependence, uncomplicated: Secondary | ICD-10-CM | POA: Diagnosis not present

## 2021-04-05 DIAGNOSIS — F1020 Alcohol dependence, uncomplicated: Secondary | ICD-10-CM | POA: Diagnosis not present

## 2021-04-07 DIAGNOSIS — F1020 Alcohol dependence, uncomplicated: Secondary | ICD-10-CM | POA: Diagnosis not present

## 2021-04-10 DIAGNOSIS — F1020 Alcohol dependence, uncomplicated: Secondary | ICD-10-CM | POA: Diagnosis not present

## 2021-04-11 DIAGNOSIS — F1020 Alcohol dependence, uncomplicated: Secondary | ICD-10-CM | POA: Diagnosis not present

## 2021-04-12 DIAGNOSIS — F1020 Alcohol dependence, uncomplicated: Secondary | ICD-10-CM | POA: Diagnosis not present

## 2021-04-14 DIAGNOSIS — F1020 Alcohol dependence, uncomplicated: Secondary | ICD-10-CM | POA: Diagnosis not present

## 2021-04-17 DIAGNOSIS — F1020 Alcohol dependence, uncomplicated: Secondary | ICD-10-CM | POA: Diagnosis not present

## 2021-04-18 DIAGNOSIS — F1020 Alcohol dependence, uncomplicated: Secondary | ICD-10-CM | POA: Diagnosis not present

## 2021-04-19 ENCOUNTER — Encounter: Payer: Self-pay | Admitting: Family Medicine

## 2021-04-19 DIAGNOSIS — F1020 Alcohol dependence, uncomplicated: Secondary | ICD-10-CM | POA: Diagnosis not present

## 2021-04-21 DIAGNOSIS — F1020 Alcohol dependence, uncomplicated: Secondary | ICD-10-CM | POA: Diagnosis not present

## 2021-04-24 DIAGNOSIS — F1020 Alcohol dependence, uncomplicated: Secondary | ICD-10-CM | POA: Diagnosis not present

## 2021-04-25 DIAGNOSIS — F1020 Alcohol dependence, uncomplicated: Secondary | ICD-10-CM | POA: Diagnosis not present

## 2021-04-26 DIAGNOSIS — F1020 Alcohol dependence, uncomplicated: Secondary | ICD-10-CM | POA: Diagnosis not present

## 2021-04-28 DIAGNOSIS — F1020 Alcohol dependence, uncomplicated: Secondary | ICD-10-CM | POA: Diagnosis not present

## 2021-05-01 DIAGNOSIS — F1020 Alcohol dependence, uncomplicated: Secondary | ICD-10-CM | POA: Diagnosis not present

## 2021-05-02 DIAGNOSIS — F1020 Alcohol dependence, uncomplicated: Secondary | ICD-10-CM | POA: Diagnosis not present

## 2021-05-03 DIAGNOSIS — F1020 Alcohol dependence, uncomplicated: Secondary | ICD-10-CM | POA: Diagnosis not present

## 2021-05-05 DIAGNOSIS — F1020 Alcohol dependence, uncomplicated: Secondary | ICD-10-CM | POA: Diagnosis not present

## 2021-05-08 DIAGNOSIS — F1020 Alcohol dependence, uncomplicated: Secondary | ICD-10-CM | POA: Diagnosis not present

## 2021-05-09 DIAGNOSIS — F1020 Alcohol dependence, uncomplicated: Secondary | ICD-10-CM | POA: Diagnosis not present

## 2021-05-10 DIAGNOSIS — F1020 Alcohol dependence, uncomplicated: Secondary | ICD-10-CM | POA: Diagnosis not present

## 2021-05-12 DIAGNOSIS — F1020 Alcohol dependence, uncomplicated: Secondary | ICD-10-CM | POA: Diagnosis not present

## 2021-05-15 DIAGNOSIS — F1020 Alcohol dependence, uncomplicated: Secondary | ICD-10-CM | POA: Diagnosis not present

## 2021-05-16 DIAGNOSIS — F1020 Alcohol dependence, uncomplicated: Secondary | ICD-10-CM | POA: Diagnosis not present

## 2021-05-17 DIAGNOSIS — F1020 Alcohol dependence, uncomplicated: Secondary | ICD-10-CM | POA: Diagnosis not present

## 2021-05-19 DIAGNOSIS — F1020 Alcohol dependence, uncomplicated: Secondary | ICD-10-CM | POA: Diagnosis not present

## 2022-02-05 ENCOUNTER — Other Ambulatory Visit: Payer: Self-pay | Admitting: Family Medicine

## 2022-04-11 ENCOUNTER — Other Ambulatory Visit: Payer: Self-pay | Admitting: Family Medicine

## 2022-04-13 ENCOUNTER — Ambulatory Visit: Payer: Self-pay | Admitting: Family Medicine

## 2022-04-18 ENCOUNTER — Ambulatory Visit: Payer: BC Managed Care – PPO | Admitting: Family Medicine

## 2022-04-18 ENCOUNTER — Encounter: Payer: Self-pay | Admitting: Family Medicine

## 2022-04-18 VITALS — BP 116/71 | HR 88 | Temp 98.7°F | Ht 69.0 in | Wt 182.0 lb

## 2022-04-18 DIAGNOSIS — K219 Gastro-esophageal reflux disease without esophagitis: Secondary | ICD-10-CM | POA: Diagnosis not present

## 2022-04-18 DIAGNOSIS — J309 Allergic rhinitis, unspecified: Secondary | ICD-10-CM | POA: Diagnosis not present

## 2022-04-18 DIAGNOSIS — Z23 Encounter for immunization: Secondary | ICD-10-CM

## 2022-04-18 MED ORDER — ESOMEPRAZOLE MAGNESIUM 40 MG PO CPDR: DELAYED_RELEASE_CAPSULE | ORAL | 3 refills | Status: DC

## 2022-04-18 MED ORDER — FLUTICASONE PROPIONATE 50 MCG/ACT NA SUSP: 2.0000 | Freq: Every day | NASAL | 3 refills | Status: DC | PRN

## 2022-04-18 NOTE — Progress Notes (Signed)
Office Note 04/18/2022  CC:  Chief Complaint  Patient presents with   Follow-up    GERD; pt is not fasting    Patient is a 46 y.o. male who is here for follow-up GERD and allergic rhinitis. A/P as of last visit: "1) Allergic rhinitis: not well controlled on flonase anymore. OTC oral antihistamine trial no help per pt. Will do trial of dymista 1 spray each nostril q12h. Cont neti pot regularly. Consider future add-on of singulair or switch to totally new nasal steroid spray.   2) GERD: stable on nexium 40mg  qd.   3) ED: good response and no adverse effects to cialis 10mg  qd prn.  RF'd today.   4) Health maintenance exam: Reviewed age and gender appropriate health maintenance issues (prudent diet, regular exercise, health risks of tobacco and excessive alcohol, use of seatbelts, fire alarms in home, use of sunscreen).  Also reviewed age and gender appropriate health screening as well as vaccine recommendations. Vaccines: Tdap booster due->I forgot to give this to him today. Labs: fasting labs to be done via his employer and he'll drop results off here.  If he doesn't get this done through them he'll call and request fasting lab appt and I'll do HP labs. Prostate ca screening: average risk patient= as per latest guidelines, start screening at 67 yrs of age. Colon ca screening: average risk patient= as per latest guidelines, start screening at 50 yrs of age.   INTERIM HX: Larry House is doing pretty good overall. Had to leave his job at 46. Now has a job working on diesel trucks, 54.  Says his reflux is pretty well controlled as long as he avoids fried foods.  He takes his Nexium 40 mg every day.    He no longer takes any Cialis because it caused bad headache.  He takes Flonase daily and finds this helpful.  Past Medical History:  Diagnosis Date   alcoholism    detox->[facility] 03/2021   GERD (gastroesophageal reflux disease)    24h pH probe FAILED  03/31/15.  Hx of erosive esophagitis on EGD 03/31/15   History of chest pain 2019   Cath 02/2018 (pt declined stress testing b/c he has little faith in them): entirely clean.   History of cocaine abuse (HCC)    s/p rehab 2001: no drug abuse since that time.   History of kidney stones    Lumbar spondylosis    with mild spondylolisthesis L5 on S1.  Intermittent LBP with R leg radiculopathy.   Organic erectile dysfunction    Recurrent kidney stones    Ca++ oxalate.  Flomax helps pass.  ++Recurrent--has seen urologist in remote past.   Seasonal allergic rhinitis    Tobacco dependence    Ureterolithiasis 10/2018   ESWL planned as of 10/24/2018 urol o/v.    Past Surgical History:  Procedure Laterality Date   BRAVO [facility] STUDY  03/31/2015   Failed 48 - hour Bravo pH probe - Dr. [facility]   ESOPHAGEAL MANOMETRY  04/29/2015   Essentially normal esophageal manometry. - Dr. Cammy Brochure   EXTRACORPOREAL SHOCK WAVE LITHOTRIPSY Left 10/27/2018   Procedure: EXTRACORPOREAL SHOCK WAVE LITHOTRIPSY (ESWL);  Surgeon: Cammy Brochure, MD;  Location: [facility];  Service: Urology;  Laterality: Left;   LEFT HEART CATH AND CORONARY ANGIOGRAPHY N/A 02/07/2018   No CAD.  EF normal.  Procedure: LEFT HEART CATH AND CORONARY ANGIOGRAPHY;  Surgeon: Malen Gauze, MD;  Location: [facility] INVASIVE CV LAB;  Service:  Cardiovascular;  Laterality: N/A;   UPPER GI ENDOSCOPY  12/29/2014   LA Grade A reflus esophagitis, otherwise normal. - Dr. Cammy Brochure    Family History  Problem Relation Age of Onset   Arthritis Father    Early death Father    Heart attack Father    Heart disease Father    Hyperlipidemia Father    Hypertension Father    Early death Paternal Grandfather    Hearing loss Paternal Grandfather    Heart attack Paternal Grandfather    Heart disease Paternal Grandfather    Hyperlipidemia Paternal Grandfather    Hypertension Paternal Grandfather     Social History   Socioeconomic History   Marital  status: Married    Spouse name: Not on file   Number of children: Not on file   Years of education: Not on file   Highest education level: Associate degree: occupational, Scientist, product/process development, or vocational program  Occupational History   Not on file  Tobacco Use   Smoking status: Every Day    Packs/day: 1.00    Years: 26.00    Total pack years: 26.00    Types: Cigarettes   Smokeless tobacco: Never  Vaping Use   Vaping Use: Former  Substance and Sexual Activity   Alcohol use: Yes    Alcohol/week: 12.0 standard drinks of alcohol    Types: 12 Cans of beer per week   Drug use: No   Sexual activity: Not on file  Other Topics Concern   Not on file  Social History Narrative   Married, no children.   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   Tob:25 pack-yr hx as of 11/2017.   Alc: denies hx of abuse   Drugs:hx of street drug abuse in 73s, jail for a brief period, rehab.     Clean since 2001.   Social Determinants of Health   Financial Resource Strain: Low Risk  (04/16/2022)   Overall Financial Resource Strain (CARDIA)    Difficulty of Paying Living Expenses: Not hard at all  Food Insecurity: No Food Insecurity (04/16/2022)   Hunger Vital Sign    Worried About Running Out of Food in the Last Year: Never true    Ran Out of Food in the Last Year: Never true  Transportation Needs: No Transportation Needs (04/16/2022)   PRAPARE - Administrator, Civil Service (Medical): No    Lack of Transportation (Non-Medical): No  Physical Activity: Sufficiently Active (04/16/2022)   Exercise Vital Sign    Days of Exercise per Week: 5 days    Minutes of Exercise per Session: 150+ min  Stress: Stress Concern Present (04/16/2022)   [employer] of Occupational Health - Occupational Stress Questionnaire    Feeling of Stress : To some extent  Social Connections: Unknown (04/16/2022)   Social Connection and Isolation Panel [NHANES]    Frequency of  Communication with Friends and Family: Once a week    Frequency of Social Gatherings with Friends and Family: Once a week    Attends Religious Services: Patient refused    Database administrator or Organizations: No    Attends Engineer, structural: Not on file    Marital Status: Married  Catering manager Violence: Not on file    Outpatient Medications Prior to Visit  Medication Sig Dispense Refill   tamsulosin (FLOMAX) 0.4 MG CAPS capsule Take 1 capsule (0.4 mg total) by mouth daily.  10 capsule 2   Azelastine-Fluticasone (DYMISTA) 137-50 MCG/ACT SUSP Place 1 spray into the nose in the morning and at bedtime. 23 g 11   fluticasone (FLONASE) 50 MCG/ACT nasal spray Place 2 sprays into both nostrils daily as needed for allergies. 48 g 3   esomeprazole (NEXIUM) 40 MG capsule TAKE 1 CAPSULE DAILY AT 12:00 P.M. 30 capsule 0   ibuprofen (ADVIL,MOTRIN) 200 MG tablet Take 400 mg by mouth every 8 (eight) hours as needed (for pain.). (Patient not taking: Reported on 04/18/2022)     tadalafil (CIALIS) 10 MG tablet Take 1 tablet (10 mg total) by mouth daily as needed for erectile dysfunction. 90 tablet 3   No facility-administered medications prior to visit.    Allergies  Allergen Reactions   Chlorhexidine Gluconate Itching    Skin became reddened and itchy.    PE;    04/18/2022    3:28 PM 02/06/2021    2:59 PM 10/26/2019    2:57 PM  Vitals with BMI  Height 5\' 9"  5\' 9"  5' 9.5"  Weight 182 lbs 184 lbs 207 lbs 3 oz  BMI 26.86 27.16 30.17  Systolic 116 121  Diastolic 71 81 83  Pulse 88 79 72   Gen: Alert, well appearing.  Patient is oriented to person, place, time, and situation. AFFECT: pleasant, lucid thought and speech.. CV: RRR, no m/r/g.   LUNGS: CTA bilat, nonlabored resps, good aeration in all lung fields.e EXT: no clubbing or cyanosis.  no edema.   Pertinent labs:  Lab Results  Component Value Date   TSH 1.65 10/29/2019   Lab Results  Component Value Date   WBC  11.3 (H) 10/29/2019   HGB 15.6 10/29/2019   HCT 46.9 10/29/2019   MCV 94.1 10/29/2019   PLT 299.0 10/29/2019   Lab Results  Component Value Date   CREATININE 0.98 10/29/2019   BUN 15 10/29/2019   NA 142 10/29/2019   K 4.7 10/29/2019   CL 105 10/29/2019   CO2 30 10/29/2019   Lab Results  Component Value Date   ALT 30 10/29/2019   AST 23 10/29/2019   ALKPHOS 119 (H) 10/29/2019   BILITOT 0.3 10/29/2019   Lab Results  Component Value Date   CHOL 207 (H) 10/29/2019   Lab Results  Component Value Date   HDL 48.20 10/29/2019   No results found for: "LDLCALC" Lab Results  Component Value Date   TRIG 201.0 (H) 10/29/2019   Lab Results  Component Value Date   CHOLHDL 4 10/29/2019   ASSESSMENT AND PLAN:   #1 gastroesophageal reflux disease. He has required Nexium 40 mg daily long-term. He still has some breakthrough reflux and I recommended as needed use of over-the-counter Pepcid.  Also he knows to try to avoid spicy and fried foods. Refilled Nexium 40 mg daily, #90, refill x3.  #2 allergic rhinitis, well controlled on Flonase 2 sprays each nostril daily.  Refilled today.  #3 Preventative health care: Tdap today. Prostate ca screening: average risk patient= as per latest guidelines, start screening at 2 yrs of age. Colon ca screening: average risk patient= as per latest guidelines, start screening at 73 yrs of age.   An After Visit Summary was printed and given to the patient.  FOLLOW UP:  Return in about 1 year (around 04/19/2023) for annual CPE (fasting).  Signed:  56, MD           04/18/2022

## 2022-04-18 NOTE — Addendum Note (Signed)
Addended by: Emi Holes D on: 04/18/2022 04:27 PM   Modules accepted: Orders

## 2022-08-15 ENCOUNTER — Encounter: Payer: Self-pay | Admitting: Family Medicine

## 2022-08-16 NOTE — Telephone Encounter (Signed)
Needs in-person appointment

## 2022-12-26 ENCOUNTER — Ambulatory Visit: Payer: BC Managed Care – PPO | Admitting: Family Medicine

## 2022-12-26 ENCOUNTER — Encounter: Payer: Self-pay | Admitting: Family Medicine

## 2022-12-26 VITALS — BP 122/79 | HR 90 | Temp 98.5°F | Ht 69.0 in | Wt 187.2 lb

## 2022-12-26 DIAGNOSIS — F331 Major depressive disorder, recurrent, moderate: Secondary | ICD-10-CM

## 2022-12-26 DIAGNOSIS — F524 Premature ejaculation: Secondary | ICD-10-CM

## 2022-12-26 DIAGNOSIS — L2489 Irritant contact dermatitis due to other agents: Secondary | ICD-10-CM | POA: Diagnosis not present

## 2022-12-26 MED ORDER — FLUTICASONE PROPIONATE 0.05 % EX CREA: TOPICAL_CREAM | CUTANEOUS | 2 refills | Status: DC

## 2022-12-26 MED ORDER — FLUOXETINE HCL 20 MG PO TABS: 20.0000 mg | ORAL_TABLET | Freq: Every day | ORAL | 0 refills | Status: DC

## 2022-12-26 NOTE — Progress Notes (Signed)
OFFICE VISIT  12/26/2022  CC:  Chief Complaint  Patient presents with   Rash    Both hands radiating to arms, started last Wed after cleaning rabbit pins. Rash is almost gone but flares up at night.  Got bit by a few ticks; inner thigh, knee, right side of back. Tried using otc cortisone 10 cream, used 4 times.     Patient is a 47 y.o. male who presents for rash.  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  He had soft work gloves on with short sleeves. He also noted a tick on the back of his right leg and 1 on the upper back on the right and the day or 2 around that time.  These have a red spot at them but he does not describe any bull's-eye lesion. No fever, no bodyaches, no headache.  Describes premature ejaculation, has been a problem a long time. Does have a long history of chronic depression.  No SI or HI. Was treated with medications in the past that he really cannot cannot recall the name of, although he does know he was on Paxil in the past and it never helps.  Says he was on a "cocktail "of medications at 1 point and these worked but then he moved away from that provider and never got back on anything.  He is wondering about starting an antidepressant that may also help with his premature ejaculation.  ROS: Social anxiety is present. No recent drug or alcohol abuse. No problems getting erection.  Libido intact.   Past Medical History:  Diagnosis Date   alcoholism    [facility] 03/2021   GERD (gastroesophageal reflux disease)    24h pH probe FAILED 03/31/15.  Hx of erosive esophagitis on EGD 03/31/15   History of chest pain 2019   Cath 02/2018 (pt declined stress testing b/c he has little faith in them): entirely clean.   History of cocaine abuse ([place])    s/p rehab 2001: no drug abuse since that time.   History of kidney stones    Lumbar spondylosis    with mild  spondylolisthesis L5 on S1.  Intermittent LBP with R leg radiculopathy.   Organic erectile dysfunction    Recurrent kidney stones    Ca++ oxalate.  Flomax helps pass.  ++Recurrent--has seen urologist in remote past.   Seasonal allergic rhinitis    Tobacco dependence    Ureterolithiasis 10/2018   ESWL planned as of 10/24/2018 urol o/v.    Past Surgical History:  Procedure Laterality Date   BRAVO [facility] STUDY  03/31/2015   Failed 48 - hour Bravo pH probe - Dr. Lurlean Horns   ESOPHAGEAL MANOMETRY  04/29/2015   Essentially normal esophageal manometry. - Dr. Lurlean Horns   EXTRACORPOREAL SHOCK WAVE LITHOTRIPSY Left 10/27/2018   Procedure: EXTRACORPOREAL SHOCK WAVE LITHOTRIPSY (ESWL);  Surgeon: Cleon Gustin, MD;  Location: [facility];  Service: Urology;  Laterality: Left;   LEFT HEART CATH AND CORONARY ANGIOGRAPHY N/A 02/07/2018   No CAD.  EF normal.  Procedure: LEFT HEART CATH AND CORONARY ANGIOGRAPHY;  Surgeon: Jettie Booze, MD;  Location: [place] CV LAB;  Service: Cardiovascular;  Laterality: N/A;   UPPER GI ENDOSCOPY  12/29/2014   LA Grade A reflus esophagitis, otherwise normal. - Dr. Lurlean Horns    Outpatient Medications Prior to Visit  Medication  Sig Dispense Refill   esomeprazole (NEXIUM) 40 MG capsule TAKE 1 CAPSULE DAILY AT 12:00 P.M. 90 capsule 3   fluticasone (FLONASE) 50 MCG/ACT nasal spray Place 2 sprays into both nostrils daily as needed for allergies. 48 g 3   tamsulosin (FLOMAX) 0.4 MG CAPS capsule Take 1 capsule (0.4 mg total) by mouth daily. (Patient not taking: Reported on 12/26/2022) 10 capsule 2   No facility-administered medications prior to visit.    Allergies  Allergen Reactions   Chlorhexidine Gluconate Itching    Skin became reddened and itchy.    Review of Systems  As per HPI  PE:    12/26/2022    8:32 AM 04/18/2022    3:28 PM 02/06/2021    2:59 PM  Vitals with BMI  Height 5\' 9"  5\' 9"  5\' 9"   Weight 187 lbs 3 oz 182 lbs 184 lbs  BMI 27.63 [id number]  [id number]  Systolic [id number]  Diastolic 79 71 81  Pulse 90 88 79    Physical Exam  General: Alert and well-appearing. Affect: Pleasant, lucid thought and speech. Skin: Erythematous splotchy maculopapule in right popliteal area about 2 to 3 cm in size, indurated.  Similar lesion right inner thigh.  No bull's-eye lesion. Tops of hands and both forearms have this scattered small amount of pinkish papules.  LABS:  Last CBC Lab Results  Component Value Date   WBC 11.3 (H) 10/29/2019   HGB 15.6 10/29/2019   HCT 46.9 10/29/2019   MCV 94.1 10/29/2019   MCH 30.8 12/27/2018   RDW 13.4 10/29/2019   PLT 299.0 [id number]   Last metabolic panel Lab Results  Component Value Date   GLUCOSE 85 10/29/2019   NA 142 10/29/2019   K 4.7 10/29/2019   CL 105 10/29/2019   CO2 30 10/29/2019   BUN 15 10/29/2019   CREATININE 0.98 10/29/2019   GFRNONAA >60 12/27/2018   CALCIUM 9.5 10/29/2019   PROT 6.8 10/29/2019   ALBUMIN 4.6 10/29/2019   BILITOT 0.3 10/29/2019   ALKPHOS 119 (H) 10/29/2019   AST 23 10/29/2019   ALT 30 10/29/2019   ANIONGAP 7 12/27/2018   IMPRESSION AND PLAN:  #1 contact dermatitis.  I do not think his recent tick bites are related. Prescribed Cutivate cream 0.05% to apply to affected areas twice daily.  2.  Chronic depression.  Start fluoxetine 20 mg a day. Therapeutic expectations and side effect profile of medication discussed today.  Patient's questions answered.  #3 premature ejaculation. Hopefully getting on the fluoxetine will help this.  An After Visit Summary was printed and given to the patient.  FOLLOW UP: Return in about 4 weeks (around 01/23/2023) for f/u dep/other.  Signed:  Crissie Sickles, MD           12/26/2022

## 2023-01-18 ENCOUNTER — Ambulatory Visit: Payer: BC Managed Care – PPO | Admitting: Family Medicine

## 2023-01-18 ENCOUNTER — Encounter: Payer: Self-pay | Admitting: Family Medicine

## 2023-01-18 VITALS — BP 128/84 | HR 76 | Wt 190.8 lb

## 2023-01-18 DIAGNOSIS — F32A Depression, unspecified: Secondary | ICD-10-CM

## 2023-01-18 DIAGNOSIS — F524 Premature ejaculation: Secondary | ICD-10-CM | POA: Diagnosis not present

## 2023-01-18 MED ORDER — FLUOXETINE HCL 40 MG PO CAPS: 40.0000 mg | ORAL_CAPSULE | Freq: Every day | ORAL | 1 refills | Status: DC

## 2023-01-18 NOTE — Progress Notes (Signed)
OFFICE VISIT  01/18/2023  CC:  Chief Complaint  Patient presents with   Follow-up    Follow up on meds. No other questions or concerns.    Patient is a 47 y.o. male who presents for 2-week follow-up premature ejaculation and chronic depression. A/P as of last visit: "#1 contact dermatitis.  I do not think his recent tick bites are related. Prescribed Cutivate cream 0.05% to apply to affected areas twice daily.   2.  Chronic depression.  Start fluoxetine 20 mg a day. Therapeutic expectations and side effect profile of medication discussed today.  Patient's questions answered.   #3 premature ejaculation. Hopefully getting on the fluoxetine will help this."  INTERIM HX: Not signif change regarding premature ejaculation. No improvement in mood or anxiety. Occ feeling of mild "cloudy" cognition, brief and mild.  Otherwise no side effects.   Past Medical History:  Diagnosis Date   alcoholism    [facility]/2021   GERD (gastroesophageal reflux disease)    24h pH probe FAILED 03/31/15.  Hx of erosive esophagitis on EGD 03/31/15   History of chest pain 2019   Cath 02/2018 (pt declined stress testing b/c he has little faith in them): entirely clean.   History of cocaine abuse    s/p rehab 2001: no drug abuse since that time.   History of kidney stones    Lumbar spondylosis    with mild spondylolisthesis L5 on S1.  Intermittent LBP with R leg radiculopathy.   Organic erectile dysfunction    Recurrent kidney stones    Ca++ oxalate.  Flomax helps pass.  ++Recurrent--has seen urologist in remote past.   Seasonal allergic rhinitis    Tobacco dependence    Ureterolithiasis 10/2018   ESWL planned as of 10/24/2018 urol o/v.    Past Surgical History:  Procedure Laterality Date   BRAVO [facility] STUDY  03/31/2015   Failed 48 - hour Bravo pH probe - Dr. Cammy Brochure   ESOPHAGEAL MANOMETRY  04/29/2015   Essentially normal esophageal manometry. - Dr. Cammy Brochure   EXTRACORPOREAL  SHOCK WAVE LITHOTRIPSY Left 10/27/2018   Procedure: EXTRACORPOREAL SHOCK WAVE LITHOTRIPSY (ESWL);  Surgeon: Malen Gauze, MD;  [facility];  Service: Urology;  Laterality: Left;   LEFT HEART CATH AND CORONARY ANGIOGRAPHY N/A 02/07/2018   No CAD.  EF normal.  Procedure: LEFT HEART CATH AND CORONARY ANGIOGRAPHY;  Surgeon: Corky Crafts, MD;  Location: [facility] INVASIVE CV LAB;  Service: Cardiovascular;  Laterality: N/A;   UPPER GI ENDOSCOPY  12/29/2014   LA Grade A reflus esophagitis, otherwise normal. - Dr. Cammy Brochure    Outpatient Medications Prior to Visit  Medication Sig Dispense Refill   esomeprazole (NEXIUM) 40 MG capsule TAKE 1 CAPSULE DAILY AT 12:00 P.M. 90 capsule 3   fluticasone (CUTIVATE) 0.05 % cream Apply to affected areas bid prn 30 g 2   fluticasone (FLONASE) 50 MCG/ACT nasal spray Place 2 sprays into both nostrils daily as needed for allergies. 48 g 3   tamsulosin (FLOMAX) 0.4 MG CAPS capsule Take 1 capsule (0.4 mg total) by mouth daily. 10 capsule 2   FLUoxetine (PROZAC) 20 MG tablet Take 1 tablet (20 mg total) by mouth daily. 30 tablet 0   No facility-administered medications prior to visit.    Allergies  Allergen Reactions   Chlorhexidine Gluconate Itching    Skin became reddened and itchy.    Review of Systems As per HPI  PE:    01/18/2023  9:35 AM 12/26/2022    8:32 AM 04/18/2022    3:28 PM  Vitals with BMI  Height  5\' 9"  5\' 9"   Weight 190 lbs 13 oz 187 lbs 3 oz 182 lbs  BMI 28.16 27.63 26.86  Systolic 128 122 735  Diastolic 84 79 71  Pulse 76 90 88   Physical Exam  Gen: Alert, well appearing.  Patient is oriented to person, place, time, and situation. AFFECT: pleasant, lucid thought and speech. No further exam today  LABS:  Last CBC Lab Results  Component Value Date   WBC 11.3 (H) 10/29/2019   HGB 15.6 10/29/2019   HCT 46.9 10/29/2019   MCV 94.1 10/29/2019   MCH 30.8 12/27/2018   RDW 13.4 10/29/2019   PLT 299.0 10/29/2019    Last metabolic panel Lab Results  Component Value Date   GLUCOSE 85 10/29/2019   NA 142 10/29/2019   K 4.7 10/29/2019   CL 105 10/29/2019   CO2 30 10/29/2019   BUN 15 10/29/2019   CREATININE 0.98 10/29/2019   GFRNONAA >60 12/27/2018   CALCIUM 9.5 10/29/2019   PROT 6.8 10/29/2019   ALBUMIN 4.6 10/29/2019   BILITOT 0.3 10/29/2019   ALKPHOS 119 (H) 10/29/2019   AST 23 10/29/2019   ALT 30 10/29/2019   ANIONGAP 7 12/27/2018   Last thyroid functions Lab Results  Component Value Date   TSH 1.65 10/29/2019   IMPRESSION AND PLAN:  1) Premature ejaculation: no change with trial of fluoxetine 20mg  x 2 wks. Inc dose to 40mg  qd.  2) Chronic depression/dysthymia. No change. Hope to see a response in the next couple of weeks on 40mg  fluoxetine dosing.  An After Visit Summary was printed and given to the patient.  FOLLOW UP: Return in about 4 weeks (around 02/15/2023) for f/u mood,other.  Signed:  Santiago Bumpers, MD           01/18/2023

## 2023-02-15 ENCOUNTER — Encounter: Payer: Self-pay | Admitting: Family Medicine

## 2023-02-15 ENCOUNTER — Ambulatory Visit: Payer: BC Managed Care – PPO | Admitting: Family Medicine

## 2023-02-15 VITALS — BP 120/87 | HR 87 | Wt 189.8 lb

## 2023-02-15 DIAGNOSIS — F3342 Major depressive disorder, recurrent, in full remission: Secondary | ICD-10-CM | POA: Diagnosis not present

## 2023-02-15 DIAGNOSIS — F524 Premature ejaculation: Secondary | ICD-10-CM

## 2023-02-15 DIAGNOSIS — T50905A Adverse effect of unspecified drugs, medicaments and biological substances, initial encounter: Secondary | ICD-10-CM

## 2023-02-15 MED ORDER — FLUOXETINE HCL 40 MG PO CAPS: 40.0000 mg | ORAL_CAPSULE | Freq: Every day | ORAL | 1 refills | Status: DC

## 2023-02-15 NOTE — Addendum Note (Signed)
Addended by: Jeoffrey Massed on: 02/15/2023 04:37 PM   Modules accepted: Level of Service

## 2023-02-15 NOTE — Progress Notes (Signed)
OFFICE VISIT  02/15/2023  CC:  Chief Complaint  Patient presents with   Follow-up    4 week follow up. No other questions or concerns.    Patient is a 47 y.o. male who presents for 1 month follow-up chronic depression/dysthymia in addition to premature ejaculation. A/P as of last visit: "1) Premature ejaculation: no change with trial of fluoxetine 20mg  x 2 wks. Inc dose to 40mg  qd.   2) Chronic depression/dysthymia. No change. Hope to see a response in the next couple of weeks on 40mg  fluoxetine dosing."  INTERIM HX: Mood is improved significantly. His premature ejaculation is better as well. Unfortunately fluoxetine makes him very tired.  Takes naps a lot more than usual.  Unfortunately he has chronic neck pain.  He has seen Dr. Yetta Barre, neurosurgeon.  Planned are in place to do fusion surgery C3-C4 May 24.    Past Medical History:  Diagnosis Date   alcoholism    [facility] 03/2021   GERD (gastroesophageal reflux disease)    24h pH probe FAILED 03/31/15.  Hx of erosive esophagitis on EGD 03/31/15   History of chest pain 2019   Cath 02/2018 (pt declined stress testing b/c he has little faith in them): entirely clean.   History of cocaine abuse ([facility])    s/p rehab 2001: no drug abuse since that time.   History of kidney stones    Lumbar spondylosis    with mild spondylolisthesis L5 on S1.  Intermittent LBP with R leg radiculopathy.   Organic erectile dysfunction    Recurrent kidney stones    Ca++ oxalate.  Flomax helps pass.  ++Recurrent--has seen urologist in remote past.   Seasonal allergic rhinitis    Tobacco dependence    Ureterolithiasis 10/2018   ESWL planned as of 10/24/2018 urol o/v.    Past Surgical History:  Procedure Laterality Date   BRAVO [facility] STUDY  03/31/2015   Failed 48 - hour Bravo pH probe - Dr. Cammy Brochure   ESOPHAGEAL MANOMETRY  04/29/2015   Essentially normal esophageal manometry. - Dr. Cammy Brochure   EXTRACORPOREAL SHOCK WAVE LITHOTRIPSY  Left 10/27/2018   Procedure: EXTRACORPOREAL SHOCK WAVE LITHOTRIPSY (ESWL);  Surgeon: Malen Gauze, MD;  [facility];  Service: Urology;  Laterality: Left;   LEFT HEART CATH AND CORONARY ANGIOGRAPHY N/A 02/07/2018   No CAD.  EF normal.  Procedure: LEFT HEART CATH AND CORONARY ANGIOGRAPHY;  Surgeon: Corky Crafts, MD;  Location: [facility] INVASIVE CV LAB;  Service: Cardiovascular;  Laterality: N/A;   UPPER GI ENDOSCOPY  12/29/2014   LA Grade A reflus esophagitis, otherwise normal. - Dr. Cammy Brochure    Outpatient Medications Prior to Visit  Medication Sig Dispense Refill   esomeprazole (NEXIUM) 40 MG capsule TAKE 1 CAPSULE DAILY AT 12:00 P.M. 90 capsule 3   fluticasone (CUTIVATE) 0.05 % cream Apply to affected areas bid prn 30 g 2   fluticasone (FLONASE) 50 MCG/ACT nasal spray Place 2 sprays into both nostrils daily as needed for allergies. 48 g 3   tamsulosin (FLOMAX) 0.4 MG CAPS capsule Take 1 capsule (0.4 mg total) by mouth daily. 10 capsule 2   FLUoxetine (PROZAC) 40 MG capsule Take 1 capsule (40 mg total) by mouth daily. 30 capsule 1   No facility-administered medications prior to visit.    Allergies  Allergen Reactions   Chlorhexidine Gluconate Itching    Skin became reddened and itchy.    Review of Systems As per HPI  PE:  02/15/2023    3:39 PM 01/18/2023    9:35 AM 12/26/2022    8:32 AM  Vitals with BMI  Height   5\' 9"   Weight 189 lbs 13 oz 190 lbs 13 oz 187 lbs 3 oz  BMI  28.16 27.63  Systolic 120 128 161  Diastolic 87 84 79  Pulse 87 76 90     Physical Exam  Gen: Alert, well appearing.  Patient is oriented to person, place, time, and situation. AFFECT: pleasant, lucid thought and speech. No further exam today  LABS:  Last metabolic panel Lab Results  Component Value Date   GLUCOSE 85 10/29/2019   NA 142 10/29/2019   K 4.7 10/29/2019   CL 105 10/29/2019   CO2 30 10/29/2019   BUN 15 10/29/2019   CREATININE 0.98 10/29/2019   GFRNONAA >60  12/27/2018   CALCIUM 9.5 10/29/2019   PROT 6.8 10/29/2019   ALBUMIN 4.6 10/29/2019   BILITOT 0.3 10/29/2019   ALKPHOS 119 (H) 10/29/2019   AST 23 10/29/2019   ALT 30 10/29/2019   ANIONGAP 7 12/27/2018   IMPRESSION AND PLAN:  #1 premature ejaculation and major depressive disorder.  His depression is in remission and his premature ejaculation is significantly improved on fluoxetine at the 40 mg dosing. Unfortunately, this medication is making him quite tired.  However, he wants to push through for now and not change any medications. (Future treatment options are citalopram, paroxetine, sertraline, or clomipramine).  An After Visit Summary was printed and given to the patient.  FOLLOW UP: Return in about 6 months (around 08/18/2023) for routine chronic illness f/u.  Signed:  Santiago Bumpers, MD           02/15/2023

## 2023-05-08 ENCOUNTER — Other Ambulatory Visit: Payer: Self-pay | Admitting: Family Medicine

## 2023-05-09 ENCOUNTER — Other Ambulatory Visit: Payer: Self-pay | Admitting: Family Medicine

## 2023-05-16 NOTE — Patient Instructions (Signed)
It was very nice to see you today!   PLEASE NOTE:    Please give ample time to the testing facility, and our office to run, receive and review results. Please do not call inquiring of results, even if you can see them in your chart. We will contact you as soon as we are able. If it has been over 1 week since the test was completed, and you have not yet heard from [place], then please call us.   If we ordered any referrals today, please let us know if you have not heard from their office within the next 2 weeks. You should receive a letter via MyChart confirming if the referral was approved and their office contact information to schedule.

## 2023-05-17 ENCOUNTER — Encounter: Payer: Self-pay | Admitting: Family Medicine

## 2023-05-17 ENCOUNTER — Ambulatory Visit: Payer: BC Managed Care – PPO | Admitting: Family Medicine

## 2023-05-17 VITALS — BP 116/82 | HR 70 | Wt 199.2 lb

## 2023-05-17 DIAGNOSIS — F524 Premature ejaculation: Secondary | ICD-10-CM

## 2023-05-17 DIAGNOSIS — F410 Panic disorder [episodic paroxysmal anxiety] without agoraphobia: Secondary | ICD-10-CM

## 2023-05-17 DIAGNOSIS — F411 Generalized anxiety disorder: Secondary | ICD-10-CM | POA: Diagnosis not present

## 2023-05-17 DIAGNOSIS — F3342 Major depressive disorder, recurrent, in full remission: Secondary | ICD-10-CM

## 2023-05-17 MED ORDER — ALPRAZOLAM 1 MG PO TABS: ORAL_TABLET | ORAL | 0 refills | Status: DC

## 2023-05-17 NOTE — Progress Notes (Signed)
Office Note 05/17/2023  CC:  Chief Complaint  Patient presents with   Depression    Pt states meds are working well. Does not help with anxiety.     Patient is a 47 y.o. male who is here for follow-up anxiety, history of depression, increased libido, and premature ejaculation. I last saw him 3 mo ago. A/P as of that visit; "1 premature ejaculation and major depressive disorder.  His depression is in remission and his premature ejaculation is significantly improved on fluoxetine at the 40 mg dosing. Unfortunately, this medication is making him quite tired.  However, he wants to push through for now and not change any medications. (Future treatment options are citalopram, paroxetine, sertraline, or clomipramine)."  INTERIM HX: Fluoxetine helping very well with premature ejaculation.  It has also decreased his libido.  He says his libido was super-elevated for a long time. He is pleased with these effects but states he is still having panic attacks. Says he can have a panic attack essentially anytime but knows for sure that anytime he has to be in front of people he does not know or in general in a public setting with a lot of people he will have a panic attack.  She describes his face flushing, get palpitations, feels lightheaded, very anxious.  Seeking to leave the situation immediately. This peaks in a few minutes but he is fearful the rest the day that it will happen again. He does have high level of chronic stress and anxiety.  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 All of this weighs in on him heavily.  Denies any depression recently.  He has been prescribed Klonopin in the past and had some left at home and has tried 1-2 of these tabs at a time on multiple occasions lately when he has a panic attack and says they have not done any good at all.  ROS as above, plus--> no fevers, no CP, no SOB, no wheezing, no  cough, no HAs, no rashes, no melena/hematochezia.  No polyuria or polydipsia.  No myalgias or arthralgias.  No focal weakness, paresthesias, or tremors.  No acute vision or hearing abnormalities.  No dysuria or unusual/new urinary urgency or frequency.  No recent changes in lower legs. No n/v/d or abd pain.    Past Medical History:  Diagnosis Date   alcoholism    [facility] 03/2021   GERD (gastroesophageal reflux disease)    24h pH probe FAILED 03/31/15.  Hx of erosive esophagitis on EGD 03/31/15   History of chest pain 2019   Cath 02/2018 (pt declined stress testing b/c he has little faith in them): entirely clean.   History of cocaine abuse (HCC)    s/p rehab 2001: no drug abuse since that time.   History of kidney stones    Lumbar spondylosis    with mild spondylolisthesis L5 on S1.  Intermittent LBP with R leg radiculopathy.   Organic erectile dysfunction    Recurrent kidney stones    Ca++ oxalate.  Flomax helps pass.  ++Recurrent--has seen urologist in remote past.   Recurrent major depression (HCC)    Seasonal allergic rhinitis    Tobacco dependence    Ureterolithiasis 10/2018   ESWL planned as of 10/24/2018 urol o/v.    Past Surgical History:  Procedure Laterality Date   BRAVO [facility] STUDY  03/31/2015   Failed 48 - hour Bravo pH probe - Dr.  Cammy Brochure   ESOPHAGEAL MANOMETRY  04/29/2015   Essentially normal esophageal manometry. - Dr. Cammy Brochure   EXTRACORPOREAL SHOCK WAVE LITHOTRIPSY Left 10/27/2018   Procedure: EXTRACORPOREAL SHOCK WAVE LITHOTRIPSY (ESWL);  Surgeon: Malen Gauze, MD;  Location: [facility];  Service: Urology;  Laterality: Left;   LEFT HEART CATH AND CORONARY ANGIOGRAPHY N/A 02/07/2018   No CAD.  EF normal.  Procedure: LEFT HEART CATH AND CORONARY ANGIOGRAPHY;  Surgeon: Corky Crafts, MD;  Location: [facility] INVASIVE CV LAB;  Service: Cardiovascular;  Laterality: N/A;   UPPER GI ENDOSCOPY  12/29/2014   LA Grade A reflus esophagitis, otherwise normal.  - Dr. Cammy Brochure    Family History  Problem Relation Age of Onset   Arthritis Father    Early death Father    Heart attack Father    Heart disease Father    Hyperlipidemia Father    Hypertension Father    Early death Paternal Grandfather    Hearing loss Paternal Grandfather    Heart attack Paternal Grandfather    Heart disease Paternal Grandfather    Hyperlipidemia Paternal Grandfather    Hypertension Paternal Grandfather     Social History   Socioeconomic History   Marital status: Married    Spouse name: Not on file   Number of children: Not on file   Years of education: Not on file   Highest education level: Associate degree: occupational, Scientist, product/process development, or vocational program  Occupational History   Not on file  Tobacco Use   Smoking status: Every Day    Current packs/day: 1.00    Average packs/day: 1 pack/day for 26.0 years (26.0 ttl pk-yrs)    Types: Cigarettes   Smokeless tobacco: Never  Vaping Use   Vaping status: Former  Substance and Sexual Activity   Alcohol use: Yes    Alcohol/week: 12.0 standard drinks of alcohol    Types: 12 Cans of beer per week   Drug use: No   Sexual activity: Not on file  Other Topics Concern   Not on file  Social History Narrative   Married, no children.   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   Tob:25 pack-yr hx as of 11/2017.   Alc: denies hx of abuse   Drugs:hx of street drug abuse in 55s, jail for a brief period, rehab.     Clean since 2001.   Social Determinants of Health   Financial Resource Strain: Low Risk  (04/16/2022)   Overall Financial Resource Strain (CARDIA)    Difficulty of Paying Living Expenses: Not hard at all  Food Insecurity: No Food Insecurity (04/16/2022)   Hunger Vital Sign    Worried About Running Out of Food in the Last Year: Never true    Ran Out of Food in the Last Year: Never true  Transportation Needs: No Transportation Needs (04/16/2022)   PRAPARE -  Administrator, Civil Service (Medical): No    Lack of Transportation (Non-Medical): No  Physical Activity: Sufficiently Active (04/16/2022)   Exercise Vital Sign    Days of Exercise per Week: 5 days    Minutes of Exercise per Session: 150+ min  Stress: Stress Concern Present (04/16/2022)   [employer] of Occupational Health - Occupational Stress Questionnaire    Feeling of Stress : To some extent  Social Connections: Unknown (04/16/2022)   Social Connection and Isolation Panel [NHANES]    Frequency of Communication with Friends  and Family: Once a week    Frequency of Social Gatherings with Friends and Family: Once a week    Attends Religious Services: Patient declined    Database administrator or Organizations: No    Attends Engineer, structural: Not on file    Marital Status: Married  Intimate Partner Violence: Unknown (01/11/2022)   Received from [employer], [facility]   HITS    Physically Hurt: Not on file    Insult or Talk Down To: Not on file    Threaten Physical Harm: Not on file    Scream or Curse: Not on file    Outpatient Medications Prior to Visit  Medication Sig Dispense Refill   esomeprazole (NEXIUM) 40 MG capsule TAKE 1 CAPSULE DAILY AT 12:00 P.M. 90 capsule 1   FLUoxetine (PROZAC) 40 MG capsule Take 1 capsule (40 mg total) by mouth daily. 90 capsule 1   fluticasone (FLONASE) 50 MCG/ACT nasal spray USE 2 SPRAYS IN EACH NOSTRIL DAILY AS NEEDED FOR ALLERGIES 48 g 2   tamsulosin (FLOMAX) 0.4 MG CAPS capsule Take 1 capsule (0.4 mg total) by mouth daily. (Patient not taking: Reported on 05/17/2023) 10 capsule 2   fluticasone (CUTIVATE) 0.05 % cream Apply to affected areas bid prn (Patient not taking: Reported on 05/17/2023) 30 g 2   No facility-administered medications prior to visit.    Allergies  Allergen Reactions   Chlorhexidine Gluconate Itching    Skin became reddened and itchy.    PE;    05/17/2023    3:11 PM 02/15/2023    3:39 PM  01/18/2023    9:35 AM  Vitals with BMI  Weight 199 lbs 3 oz 189 lbs 13 oz 190 lbs 13 oz  BMI   28.16  Systolic 116 120 253  Diastolic 82 87 84  Pulse 70 87 76    Gen: Alert, well appearing.  Patient is oriented to person, place, time, and situation. AFFECT: pleasant, lucid thought and speech. No further exam today  Pertinent labs:  Lab Results  Component Value Date   TSH 1.65 10/29/2019   Lab Results  Component Value Date   WBC 11.3 (H) 10/29/2019   HGB 15.6 10/29/2019   HCT 46.9 10/29/2019   MCV 94.1 10/29/2019   PLT 299.0 10/29/2019   Lab Results  Component Value Date   CREATININE 0.98 10/29/2019   BUN 15 10/29/2019   NA 142 10/29/2019   K 4.7 10/29/2019   CL 105 10/29/2019   CO2 30 10/29/2019   Lab Results  Component Value Date   ALT 30 10/29/2019   AST 23 10/29/2019   ALKPHOS 119 (H) 10/29/2019   BILITOT 0.3 10/29/2019   Lab Results  Component Value Date   CHOL 207 (H) 10/29/2019   Lab Results  Component Value Date   HDL 48.20 10/29/2019   No results found for: "LDLCALC" Lab Results  Component Value Date   TRIG 201.0 (H) 10/29/2019   Lab Results  Component Value Date   CHOLHDL 4 10/29/2019   ASSESSMENT AND PLAN:   #1 recurrent major depressive disorder. Doing well on fluoxetine 40 mg a day.  #2 premature ejaculation, excessive libido. These problems have responded great to the fluoxetine.  3.  Generalized anxiety disorder with panic attacks. Not improved on fluoxetine. We discussed trial of alprazolam 1 mg, 1-2 twice daily as needed. We did discuss that this is a controlled substance and potentially addictive. He expressed understanding and wants to proceed.  An After Visit Summary was printed and given to the patient.  FOLLOW UP:  Return in about 6 months (around 11/17/2023) for routine chronic illness f/u.  Signed:  Santiago Bumpers, MD           05/17/2023

## 2023-06-24 ENCOUNTER — Encounter: Payer: Self-pay | Admitting: Family Medicine

## 2023-06-24 ENCOUNTER — Other Ambulatory Visit: Payer: Self-pay

## 2023-06-24 MED ORDER — ALPRAZOLAM 1 MG PO TABS: ORAL_TABLET | ORAL | 0 refills | Status: DC

## 2023-06-24 NOTE — Telephone Encounter (Signed)
Prescription Request  06/24/2023  LOV: Visit date not found  What is the name of the medication or equipment?  ALPRAZolam (XANAX) 1 MG tablet   Patient is not out of meds.  He has about 2 weeks left. However, it wants to get rx thru mail order, and it takes a while to get medication ordered and delivered. Patient requesting 90 d/s if possible.  Have you contacted your pharmacy to request a refill? No   Which pharmacy would you like this sent to?  [id number] - [place], [place] - [address] [place] [address] Phone: [phone] Fax: [phone]  [facility] - [place], [place] - [address] [place] [address] Phone: [phone] Fax: [phone]    Patient notified that their request is being sent to the clinical staff for review and that they should receive a response within 2 business days.   Please advise at Mobile [phone] (mobile)

## 2023-06-24 NOTE — Telephone Encounter (Signed)
Requesting: ALPRAZolam Prudy Feeler) 1 MG tablet  Contract: N/A UDS: N/A Last Visit: 05/17/23 Next Visit: 08/23/23 Last Refill: 05/17/23 (120.0)  Please Advise. Med pending

## 2023-06-24 NOTE — Telephone Encounter (Signed)
90-day prescription has been sent

## 2023-07-18 ENCOUNTER — Telehealth: Payer: Self-pay | Admitting: Family Medicine

## 2023-07-18 NOTE — Telephone Encounter (Addendum)
Pt brought paper with requested information, placed on PCP desk for letter completion. Pt confirmed he will pick up letter once done.

## 2023-07-18 NOTE — Telephone Encounter (Signed)
Patient recently had a Dot physical and he reports that he was instructed to contact his PCP for a note that states he is allowed to drive under the medication ALPRAZolam (XANAX) 1 MG tablet .  Please contact the patient for additional details on exactly what is needed.

## 2023-07-19 ENCOUNTER — Encounter: Payer: Self-pay | Admitting: Family Medicine

## 2023-07-19 ENCOUNTER — Other Ambulatory Visit: Payer: Self-pay

## 2023-07-19 NOTE — Telephone Encounter (Signed)
Okay letter printed and signed.

## 2023-07-19 NOTE — Telephone Encounter (Signed)
Pt advised letter completed and placed up front for pick up.

## 2023-07-22 ENCOUNTER — Other Ambulatory Visit: Payer: Self-pay | Admitting: Family Medicine

## 2023-07-23 ENCOUNTER — Encounter: Payer: Self-pay | Admitting: Family Medicine

## 2023-08-07 ENCOUNTER — Other Ambulatory Visit: Payer: Self-pay

## 2023-08-07 MED ORDER — FLUOXETINE HCL 40 MG PO CAPS: 40.0000 mg | ORAL_CAPSULE | Freq: Every day | ORAL | 0 refills | Status: DC

## 2023-08-11 ENCOUNTER — Other Ambulatory Visit: Payer: Self-pay | Admitting: Family Medicine

## 2023-08-13 ENCOUNTER — Encounter: Payer: Self-pay | Admitting: Family Medicine

## 2023-08-13 MED ORDER — ALPRAZOLAM 1 MG PO TABS: ORAL_TABLET | ORAL | 0 refills | Status: DC

## 2023-08-13 NOTE — Telephone Encounter (Signed)
Pt sent refill request via MyChart, sent to provider for approval and waiting for provider response.

## 2023-08-13 NOTE — Telephone Encounter (Signed)
Pt is requesting new rx, he only received 45/180 tabs.  Please fill, if appropriate.

## 2023-08-16 ENCOUNTER — Telehealth: Payer: Self-pay | Admitting: Family Medicine

## 2023-08-16 MED ORDER — ALPRAZOLAM 1 MG PO TABS: ORAL_TABLET | ORAL | 0 refills | Status: DC

## 2023-08-16 NOTE — Telephone Encounter (Signed)
Patient is scheduled for an appointment on 11/13, however he states that he is running low on   ALPRAZolam Prudy Feeler) 1 MG tablet   And is requesting enough until his appointment on 11/13. He reports that he probably has at least 3-4 days worth of medication left. The local pharmacy to call the medication in to is [facility] located on [address] in [place].

## 2023-08-16 NOTE — Telephone Encounter (Signed)
Okay prescription sent

## 2023-08-16 NOTE — Telephone Encounter (Signed)
Pt is requesting partial refill on Alprazolam sent to [facility] on [address]. Next OV 11/13

## 2023-08-20 ENCOUNTER — Telehealth: Payer: Self-pay | Admitting: Family Medicine

## 2023-08-20 NOTE — Telephone Encounter (Signed)
[facility] with [facility] called to inquire about two medications and possible interactions. Xanax and Tramadol. Please give Larry House a call at [phone] Ref number [phone]

## 2023-08-21 ENCOUNTER — Encounter: Payer: Self-pay | Admitting: Family Medicine

## 2023-08-21 ENCOUNTER — Ambulatory Visit: Payer: BC Managed Care – PPO | Admitting: Family Medicine

## 2023-08-21 VITALS — BP 129/84 | HR 72 | Wt 204.2 lb

## 2023-08-21 DIAGNOSIS — G894 Chronic pain syndrome: Secondary | ICD-10-CM

## 2023-08-21 DIAGNOSIS — F411 Generalized anxiety disorder: Secondary | ICD-10-CM

## 2023-08-21 DIAGNOSIS — F3342 Major depressive disorder, recurrent, in full remission: Secondary | ICD-10-CM | POA: Diagnosis not present

## 2023-08-21 DIAGNOSIS — M542 Cervicalgia: Secondary | ICD-10-CM | POA: Diagnosis not present

## 2023-08-21 DIAGNOSIS — G8929 Other chronic pain: Secondary | ICD-10-CM

## 2023-08-21 DIAGNOSIS — Z79899 Other long term (current) drug therapy: Secondary | ICD-10-CM | POA: Diagnosis not present

## 2023-08-21 DIAGNOSIS — Z23 Encounter for immunization: Secondary | ICD-10-CM | POA: Diagnosis not present

## 2023-08-21 MED ORDER — ALPRAZOLAM 1 MG PO TABS: ORAL_TABLET | ORAL | 1 refills | Status: DC

## 2023-08-21 NOTE — Progress Notes (Signed)
OFFICE VISIT  08/21/2023  CC:  Chief Complaint  Patient presents with   Anxiety    Patient is a 47 y.o. male who presents for 43-month follow-up depression and anxiety. A/P as of last visit: "#1 recurrent major depressive disorder. Doing well on fluoxetine 40 mg a day.   #2 premature ejaculation, excessive libido. These problems have responded great to the fluoxetine.   3.  Generalized anxiety disorder with panic attacks. Not improved on fluoxetine. We discussed trial of alprazolam 1 mg, 1-2 twice daily as needed. We did discuss that this is a controlled substance and potentially addictive. He expressed understanding and wants to proceed."  INTERIM HX: Still undergoing lots of stress and anxiety. He needs to take 2 tabs of the alprazolam 1 mg twice a day. He feels like the fluoxetine 40 mg a day is still helping well.  He has ongoing neck pain, followed by neurosurgery. Further surgeries are an option but there is no guarantee that they will help.  He is holding off for now.  He got 15% disability recommended by his neurosurgeon and they plan recheck in 2 years. In the meantime he has been taking some tramadol prescribed by the neurosurgeon and that provider recommended that Larry House get this through me now. Larry House usually takes 1 tab a day of this, occasionally 2. It does help.  No adverse side effects.  PMP AWARE reviewed today: most recent rx for alprazolam 1 mg was filled 08/16/2023, # 12, rx by me. Most recent alprazolam prescription filled prior to that was 06/28/2023, #180, prescription by me. No red flags. Most recent tramadol rx filled 08/15/23, #90.  Past Medical History:  Diagnosis Date   alcoholism    [facility] 03/2021   GERD (gastroesophageal reflux disease)    24h pH probe FAILED 03/31/15.  Hx of erosive esophagitis on EGD 03/31/15   History of chest pain 2019   Cath 02/2018 (pt declined stress testing b/c he has little faith in them): entirely clean.    History of cocaine abuse (HCC)    s/p rehab 2001: no drug abuse since that time.   History of kidney stones    Lumbar spondylosis    with mild spondylolisthesis L5 on S1.  Intermittent LBP with R leg radiculopathy.   Organic erectile dysfunction    Recurrent kidney stones    Ca++ oxalate.  Flomax helps pass.  ++Recurrent--has seen urologist in remote past.   Recurrent major depression (HCC)    Seasonal allergic rhinitis    Tobacco dependence    Ureterolithiasis 10/2018   ESWL planned as of 10/24/2018 urol o/v.    Past Surgical History:  Procedure Laterality Date   BRAVO [facility] STUDY  03/31/2015   Failed 48 - hour Bravo pH probe - Dr. Cammy Brochure   ESOPHAGEAL MANOMETRY  04/29/2015   Essentially normal esophageal manometry. - Dr. Cammy Brochure   EXTRACORPOREAL SHOCK WAVE LITHOTRIPSY Left 10/27/2018   Procedure: EXTRACORPOREAL SHOCK WAVE LITHOTRIPSY (ESWL);  Surgeon: Malen Gauze, MD;  Location: [facility];  Service: Urology;  Laterality: Left;   LEFT HEART CATH AND CORONARY ANGIOGRAPHY N/A 02/07/2018   No CAD.  EF normal.  Procedure: LEFT HEART CATH AND CORONARY ANGIOGRAPHY;  Surgeon: Corky Crafts, MD;  Location: [facility] INVASIVE CV LAB;  Service: Cardiovascular;  Laterality: N/A;   UPPER GI ENDOSCOPY  12/29/2014   LA Grade A reflus esophagitis, otherwise normal. - Dr. Cammy Brochure    Outpatient Medications Prior to Visit  Medication  Sig Dispense Refill   esomeprazole (NEXIUM) 40 MG capsule TAKE 1 CAPSULE DAILY AT 12:00 P.M. 90 capsule 1   FLUoxetine (PROZAC) 40 MG capsule Take 1 capsule (40 mg total) by mouth daily. 90 capsule 0   fluticasone (FLONASE) 50 MCG/ACT nasal spray USE 2 SPRAYS IN EACH NOSTRIL DAILY AS NEEDED FOR ALLERGIES 48 g 2   meloxicam (MOBIC) 15 MG tablet Take 15 mg by mouth daily.     tiZANidine (ZANAFLEX) 4 MG tablet Take 4 mg by mouth every 8 (eight) hours as needed.     traMADol (ULTRAM) 50 MG tablet Take 50 mg by mouth every 8 (eight) hours as needed.      tamsulosin (FLOMAX) 0.4 MG CAPS capsule Take 1 capsule (0.4 mg total) by mouth daily. (Patient not taking: Reported on 05/17/2023) 10 capsule 2   ALPRAZolam (XANAX) 1 MG tablet 1-2 tabs po bid prn anxiety 12 tablet 0   No facility-administered medications prior to visit.    Allergies  Allergen Reactions   Chlorhexidine Gluconate Itching    Skin became reddened and itchy.    Review of Systems As per HPI  PE:    08/21/2023    8:27 AM 05/17/2023    3:11 PM 02/15/2023    3:39 PM  Vitals with BMI  Weight 204 lbs 3 oz 199 lbs 3 oz 189 lbs 13 oz  Systolic 129 116 952  Diastolic 84 82 87  Pulse 72 70 87     Physical Exam  Gen: Alert, well appearing.  Patient is oriented to person, place, time, and situation. AFFECT: pleasant, lucid thought and speech. No further exam today  LABS:  Last CBC Lab Results  Component Value Date   WBC 11.3 (H) 10/29/2019   HGB 15.6 10/29/2019   HCT 46.9 10/29/2019   MCV 94.1 10/29/2019   MCH 30.8 12/27/2018   RDW 13.4 10/29/2019   PLT 299.0 10/29/2019   Last metabolic panel Lab Results  Component Value Date   GLUCOSE 85 10/29/2019   NA 142 10/29/2019   K 4.7 10/29/2019   CL 105 10/29/2019   CO2 30 10/29/2019   BUN 15 10/29/2019   CREATININE 0.98 10/29/2019   GFR 84.14 10/29/2019   CALCIUM 9.5 10/29/2019   PROT 6.8 10/29/2019   ALBUMIN 4.6 10/29/2019   BILITOT 0.3 10/29/2019   ALKPHOS 119 (H) 10/29/2019   AST 23 10/29/2019   ALT 30 10/29/2019   ANIONGAP 7 12/27/2018   IMPRESSION AND PLAN:  #1 recurrent MDD, GAD with history of panic attacks. He is doing well with fluoxetine 40 mg a day.  Also alprazolam 1 mg tabs, 2 tabs twice a day. He is had some difficulty getting appropriate refills recently and we are trying to straighten this out. My plan is to continue him on fluoxetine 40 mg tab daily, 90 days at a time with 1 additional refill. A new prescription for this was not needed today. I will also keep him on alprazolam 1 mg  tabs, 2 tabs twice a day, #360 for 90-day supply.  New prescription done today.  #2 chronic pain syndrome.  Chronic neck pain.  Will get his neurosurgeon's (Dr. Yetta Barre, [place] [facility]) office notes. I agreed to take over management of his chronic pain today.  He uses tramadol and reasonable amounts to control his pain. His plan is to see how he does over the next couple of years with unrestricted duty at work and then reevaluate the need for any surgery with  his neurosurgeon at that time. A new prescription was not needed today.  An After Visit Summary was printed and given to the patient.  FOLLOW UP: Return in about 6 months (around 02/18/2024) for annual CPE (fasting). CPE at any time Signed:  Santiago Bumpers, MD           08/21/2023

## 2023-08-21 NOTE — Telephone Encounter (Signed)
Please advise 

## 2023-08-21 NOTE — Telephone Encounter (Signed)
Returned call and discussed with [facility].

## 2023-08-21 NOTE — Telephone Encounter (Signed)
Please call Shanda Bumps with [facility] and tell her that I am aware of the potential interaction between Xanax and tramadol and I have discussed it with the patient.  I want to continue with all prescriptions as is. Thank you.

## 2023-08-23 ENCOUNTER — Ambulatory Visit: Payer: BC Managed Care – PPO | Admitting: Family Medicine

## 2023-08-23 ENCOUNTER — Other Ambulatory Visit (HOSPITAL_COMMUNITY): Payer: Self-pay

## 2023-08-26 ENCOUNTER — Telehealth: Payer: Self-pay

## 2023-08-26 MED ORDER — ALPRAZOLAM 1 MG PO TABS: ORAL_TABLET | ORAL | 0 refills | Status: DC

## 2023-08-26 NOTE — Telephone Encounter (Signed)
Patient has Alprazolam approved to arrive on 11/21. Patient requesting #12 to get him thru to when meds arrive. He is out of meds as of this morning.  #12 Alprazolam [facility] [address] [place]

## 2023-08-26 NOTE — Telephone Encounter (Signed)
Okay, #12 alprazolam sent

## 2023-09-04 ENCOUNTER — Other Ambulatory Visit (HOSPITAL_COMMUNITY): Payer: Self-pay

## 2023-11-04 ENCOUNTER — Other Ambulatory Visit: Payer: Self-pay | Admitting: Family Medicine

## 2023-11-05 ENCOUNTER — Other Ambulatory Visit: Payer: Self-pay | Admitting: Family Medicine

## 2023-11-15 NOTE — Patient Instructions (Signed)

## 2023-11-18 ENCOUNTER — Ambulatory Visit: Payer: BC Managed Care – PPO | Admitting: Family Medicine

## 2023-11-18 VITALS — BP 136/86 | HR 86 | Ht 69.0 in | Wt 205.8 lb

## 2023-11-18 DIAGNOSIS — M47812 Spondylosis without myelopathy or radiculopathy, cervical region: Secondary | ICD-10-CM | POA: Diagnosis not present

## 2023-11-18 DIAGNOSIS — M542 Cervicalgia: Secondary | ICD-10-CM | POA: Diagnosis not present

## 2023-11-18 DIAGNOSIS — Z79899 Other long term (current) drug therapy: Secondary | ICD-10-CM

## 2023-11-18 DIAGNOSIS — G8929 Other chronic pain: Secondary | ICD-10-CM

## 2023-11-18 DIAGNOSIS — F411 Generalized anxiety disorder: Secondary | ICD-10-CM | POA: Diagnosis not present

## 2023-11-18 DIAGNOSIS — F3342 Major depressive disorder, recurrent, in full remission: Secondary | ICD-10-CM

## 2023-11-18 DIAGNOSIS — F410 Panic disorder [episodic paroxysmal anxiety] without agoraphobia: Secondary | ICD-10-CM

## 2023-11-18 MED ORDER — ESOMEPRAZOLE MAGNESIUM 40 MG PO CPDR: DELAYED_RELEASE_CAPSULE | ORAL | 1 refills | Status: DC

## 2023-11-18 NOTE — Progress Notes (Signed)
OFFICE VISIT  11/18/2023  CC:  Chief Complaint  Patient presents with   Medical Management of Chronic Issues    Patient is a 48 y.o. male who presents for 26-month follow-up depression and anxiety as well as follow-up chronic neck pain. A/P as of last visit: "#1 recurrent MDD, GAD with history of panic attacks. He is doing well with fluoxetine  40 mg a day.  Also alprazolam  1 mg tabs, 2 tabs twice a day. He is had some difficulty getting appropriate refills recently and we are trying to straighten this out. My plan is to continue him on fluoxetine  40 mg tab daily, 90 days at a time with 1 additional refill. A new prescription for this was not needed today. I will also keep him on alprazolam  1 mg tabs, 2 tabs twice a day, #360 for 90-day supply.  New prescription done today.   #2 chronic pain syndrome.  Chronic neck pain.  Will get his neurosurgeon's (Dr. Rochelle Chu, [facility]) office notes. I agreed to take over management of his chronic pain today.  He uses tramadol  and reasonable amounts to control his pain. His plan is to see how he does over the next couple of years with unrestricted duty at work and then reevaluate the need for any surgery with his neurosurgeon at that time."  INTERIM HX: Larry House is doing well. He does deal with some posterior midline neck pain and stiffness but most the time it is mild and depends on what he is doing at work. 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  He does get good benefit from as needed tramadol . No panic attacks or severe anxiety.  Mood is good.  PMP AWARE reviewed today: most recent rx for tramadol  was filled 10/15/2023, # 90, rx by Dr. Waymond Hailey. Most recent alprazolam  prescription filled 10/08/2023, #360, prescription by me. No red flags.  Past Medical History:  Diagnosis Date   alcoholism    [facility]ox[facility] 03/2021   Cervical radiculopathy    Dr. Waymond Hailey   GERD (gastroesophageal reflux disease)    24h pH probe FAILED 03/31/15.  Hx of erosive esophagitis on EGD 03/31/15   History of chest pain 2019   Cath 02/2018 (pt declined stress testing b/c he has little faith in them): entirely clean.   History of cocaine abuse (HCC)    s/p rehab 2001: no drug abuse since that time.   History of kidney stones    Lumbar spondylosis    with mild spondylolisthesis L5 on S1.  Intermittent LBP with R leg radiculopathy.   Organic erectile dysfunction    Recurrent kidney stones    Ca++ oxalate.  Flomax  helps pass.  ++Recurrent--has seen urologist in remote past.   Recurrent major depression (HCC)    Seasonal allergic rhinitis    Tobacco dependence    Ureterolithiasis 10/2018   ESWL planned as of 10/24/2018 urol o/v.    Past Surgical History:  Procedure Laterality Date   BRAVO [facility] STUDY  03/31/2015   Failed 48 - hour Bravo pH probe - Dr. Norvell Beers   ESOPHAGEAL MANOMETRY  04/29/2015   Essentially normal esophageal manometry. - Dr. Norvell Beers   EXTRACORPOREAL SHOCK WAVE LITHOTRIPSY Left 10/27/2018   Procedure: EXTRACORPOREAL SHOCK WAVE LITHOTRIPSY (ESWL);  Surgeon: Marco Severs, MD;  Location: [facility];  Service: Urology;  Laterality: Left;   LEFT HEART CATH AND CORONARY ANGIOGRAPHY N/A 02/07/2018  No CAD.  EF normal.  Procedure: LEFT HEART CATH AND CORONARY ANGIOGRAPHY;  Surgeon: Lucendia Rusk, MD;  Location: [facility] INVASIVE CV LAB;  Service: Cardiovascular;  Laterality: N/A;   UPPER GI ENDOSCOPY  12/29/2014   LA Grade A reflus esophagitis, otherwise normal. - Dr. Norvell Beers    Outpatient Medications Prior to Visit  Medication Sig Dispense Refill   ALPRAZolam  (XANAX ) 1 MG tablet 2 tabs po bid 12 tablet 0   FLUoxetine  (PROZAC ) 40 MG capsule TAKE 1 CAPSULE DAILY 90 capsule 0   fluticasone  (FLONASE ) 50 MCG/ACT nasal spray USE 2 SPRAYS IN EACH NOSTRIL DAILY AS NEEDED FOR ALLERGIES 48 g 2   meloxicam (MOBIC) 15 MG tablet Take 15 mg by mouth  daily.     tamsulosin  (FLOMAX ) 0.4 MG CAPS capsule Take 1 capsule (0.4 mg total) by mouth daily. (Patient not taking: Reported on 05/17/2023) 10 capsule 2   tiZANidine (ZANAFLEX) 4 MG tablet Take 4 mg by mouth every 8 (eight) hours as needed.     traMADol  (ULTRAM ) 50 MG tablet Take 50 mg by mouth every 8 (eight) hours as needed.     esomeprazole  (NEXIUM ) 40 MG capsule TAKE 1 CAPSULE DAILY AT 12:00 P.M. 90 capsule 1   No facility-administered medications prior to visit.    Allergies  Allergen Reactions   Chlorhexidine Gluconate Itching    Skin became reddened and itchy.    Review of Systems As per HPI  PE:    11/18/2023    3:21 PM 08/21/2023    8:27 AM 05/17/2023    3:11 PM  Vitals with BMI  Height 5\' 9"     Weight 205 lbs 13 oz 204 lbs 3 oz 199 lbs 3 oz  BMI 30.38    Systolic 136 129 409  Diastolic 86 84 82  Pulse 86 72 70     Physical Exam  Gen: Alert, well appearing.  Patient is oriented to person, place, time, and situation. AFFECT: pleasant, lucid thought and speech.   LABS:  Last metabolic panel Lab Results  Component Value Date   GLUCOSE 85 10/29/2019   NA 142 10/29/2019   K 4.7 10/29/2019   CL 105 10/29/2019   CO2 30 10/29/2019   BUN 15 10/29/2019   CREATININE 0.98 10/29/2019   GFR 84.14 10/29/2019   CALCIUM 9.5 10/29/2019   PROT 6.8 10/29/2019   ALBUMIN 4.6 10/29/2019   BILITOT 0.3 10/29/2019   ALKPHOS 119 (H) 10/29/2019   AST 23 10/29/2019   ALT 30 10/29/2019   ANIONGAP 7 12/27/2018      IMPRESSION AND PLAN:  #1 recurrent MDD, GAD with history of panic attacks. He is doing well with fluoxetine  40 mg a day.  Also alprazolam  1 mg tabs, 2 tabs twice a day.  New prescriptions were not needed today.  2 chronic pain syndrome.  Chronic neck pain, cerv DDD/spondylosis. Mild-to-mod, well managed with activity modification and as -needed use of tramadol . I will assume responsibility for prescribing his tramadol  in the future.  New prescription was not  needed today. His plan is to see how he does over the next couple of years with unrestricted duty at work and then reevaluate the need for any surgery with his neurosurgeon at that time  An After Visit Summary was printed and given to the patient.  FOLLOW UP: Return in about 6 months (around 05/17/2024) for annual CPE (fasting).  Signed:  Phil Randi Poullard, MD  11/18/2023  

## 2024-01-24 ENCOUNTER — Other Ambulatory Visit: Payer: Self-pay | Admitting: Family Medicine

## 2024-01-26 ENCOUNTER — Other Ambulatory Visit: Payer: Self-pay | Admitting: Family Medicine

## 2024-01-28 ENCOUNTER — Encounter: Payer: Self-pay | Admitting: Family Medicine

## 2024-01-28 MED ORDER — TRAMADOL HCL 50 MG PO TABS: 50.0000 mg | ORAL_TABLET | Freq: Three times a day (TID) | ORAL | 5 refills | Status: DC | PRN

## 2024-01-28 NOTE — Telephone Encounter (Signed)
 Prescription sent

## 2024-02-03 ENCOUNTER — Other Ambulatory Visit: Payer: Self-pay | Admitting: Family Medicine

## 2024-02-19 ENCOUNTER — Ambulatory Visit: Payer: BC Managed Care – PPO | Admitting: Family Medicine

## 2024-03-04 ENCOUNTER — Encounter: Payer: Self-pay | Admitting: Family Medicine

## 2024-03-04 NOTE — Telephone Encounter (Signed)
 Pls confirm with Max how many he is taking per day.  According to online database [facility] sent him #360 on 01/07/24, which would be a 90d supply if he takes 2 tabs twice a day. Aaron Aas

## 2024-03-04 NOTE — Telephone Encounter (Signed)
 Patient is currently taking 2 tabs twice a day, he is not out of this medication but does not have any refills remaining.

## 2024-03-06 ENCOUNTER — Other Ambulatory Visit (HOSPITAL_COMMUNITY): Payer: Self-pay

## 2024-03-06 ENCOUNTER — Telehealth: Payer: Self-pay

## 2024-03-06 NOTE — Telephone Encounter (Signed)
 Pharmacy Patient Advocate Encounter   Received notification from Pt Calls Messages that prior authorization for traMADol  HCl 50MG  tablets is required/requested.   Insurance verification completed.   The patient is insured through [employer] .   Per test claim: PA required; PA submitted to above mentioned insurance via CoverMyMeds Key/confirmation #/[facility] Status is pending

## 2024-03-06 NOTE — Telephone Encounter (Signed)
 Copied from CRM [id number]. Topic: Clinical - Prescription Issue >> Mar 06, 2024 10:49 AM Earnestine Goes B wrote: Reason for CRM: pt called to advise prior authorization is required for traMADol  (ULTRAM ) 50 MG tablet pt is requesting a call to his insurance company b/s  for prior auth please call pt back at [phone]

## 2024-03-06 NOTE — Telephone Encounter (Signed)
 Please assist with prior auth completion

## 2024-03-06 NOTE — Telephone Encounter (Signed)
 Pharmacy Patient Advocate Encounter  Received notification from [facility] that Prior Authorization for traMADol  HCl 50MG  tablets has been APPROVED from 02/05/2024 to 03/06/2025 SEE OUTCOME FROM PLAN BELOW    Approved today by [facility] 2017 CaseId:[id number];Status:Approved;Review Type:Prior Auth;Coverage Start Date:02/05/2024;Coverage End Date:03/06/2025; Effective Date: 02/05/2024 Authorization Expiration Date: 03/06/2025

## 2024-03-13 ENCOUNTER — Other Ambulatory Visit (HOSPITAL_COMMUNITY): Payer: Self-pay

## 2024-03-16 MED ORDER — ALPRAZOLAM 1 MG PO TABS: ORAL_TABLET | ORAL | 0 refills | Status: DC

## 2024-03-16 NOTE — Telephone Encounter (Signed)
 Okay, prescription sent

## 2024-05-04 ENCOUNTER — Other Ambulatory Visit: Payer: Self-pay | Admitting: Family Medicine

## 2024-05-27 ENCOUNTER — Ambulatory Visit: Payer: BC Managed Care – PPO | Admitting: Family Medicine

## 2024-05-27 NOTE — Progress Notes (Deleted)
 OFFICE VISIT  05/27/2024  CC: No chief complaint on file.   Patient is a 48 y.o. male who presents for 57-month follow-up anxiety and depression. A/P as of last visit: #1 recurrent MDD, GAD with history of panic attacks. He is doing well with fluoxetine  40 mg a day.  Also alprazolam  1 mg tabs, 2 tabs twice a day.  New prescriptions were not needed today.   2 chronic pain syndrome.  Chronic neck pain, cerv DDD/spondylosis. Mild-to-mod, well managed with activity modification and as -needed use of tramadol . I will assume responsibility for prescribing his tramadol  in the future.  New prescription was not needed today. His plan is to see how he does over the next couple of years with unrestricted duty at work and then reevaluate the need for any surgery with his neurosurgeon at that time  INTERIM HX: ***  PMP AWARE reviewed today: most recent rx for alprazolam  1 mg was filled 04/13/2024, # 360, rx by me. Most recent Percocet prescription was filled 05/18/2024, #20, prescription by neurosurgery.  Most recent tramadol  prescription was filled 04/13/2024 #90, prescription by me. No red flags.   Past Medical History:  Diagnosis Date   alcoholism    [facility] 03/2021   Cervical radiculopathy    Dr. Alm Molt   GERD (gastroesophageal reflux disease)    24h pH probe FAILED 03/31/15.  Hx of erosive esophagitis on EGD 03/31/15   History of chest pain 2019   Cath 02/2018 (pt declined stress testing b/c he has little faith in them): entirely clean.   History of cocaine abuse (HCC)    s/p rehab 2001: no drug abuse since that time.   History of kidney stones    Lumbar spondylosis    with mild spondylolisthesis L5 on S1.  Intermittent LBP with R leg radiculopathy.   Organic erectile dysfunction    Recurrent kidney stones    Ca++ oxalate.  Flomax  helps pass.  ++Recurrent--has seen urologist in remote past.   Recurrent major depression (HCC)    Seasonal allergic rhinitis    Tobacco  dependence    Ureterolithiasis 10/2018   ESWL planned as of 10/24/2018 urol o/v.    Past Surgical History:  Procedure Laterality Date   BRAVO [facility] STUDY  03/31/2015   Failed 48 - hour Bravo pH probe - Dr. Oneil Brass   ESOPHAGEAL MANOMETRY  04/29/2015   Essentially normal esophageal manometry. - Dr. Oneil Brass   EXTRACORPOREAL SHOCK WAVE LITHOTRIPSY Left 10/27/2018   Procedure: EXTRACORPOREAL SHOCK WAVE LITHOTRIPSY (ESWL);  Surgeon: Sherrilee Belvie CROME, MD;  Location: [facility];  Service: Urology;  Laterality: Left;   LEFT HEART CATH AND CORONARY ANGIOGRAPHY N/A 02/07/2018   No CAD.  EF normal.  Procedure: LEFT HEART CATH AND CORONARY ANGIOGRAPHY;  Surgeon: Dann Candyce RAMAN, MD;  Location: [facility] INVASIVE CV LAB;  Service: Cardiovascular;  Laterality: N/A;   UPPER GI ENDOSCOPY  12/29/2014   LA Grade A reflus esophagitis, otherwise normal. - Dr. Oneil Brass    Outpatient Medications Prior to Visit  Medication Sig Dispense Refill   PREVIDENT 5000 BOOSTER PLUS 1.1 % PSTE      QUEtiapine (SEROQUEL) 100 MG tablet Take 100 mg by mouth at bedtime as needed.     ALPRAZolam  (XANAX ) 1 MG tablet 2 tabs po bid 360 tablet 0   esomeprazole  (NEXIUM ) 40 MG capsule TAKE 1 CAPSULE DAILY AT 12:00 P.M. 90 capsule 0   FLUoxetine  (PROZAC ) 40 MG capsule TAKE 1 CAPSULE DAILY 90  capsule 1   fluticasone  (FLONASE ) 50 MCG/ACT nasal spray USE 2 SPRAYS IN EACH NOSTRIL DAILY AS NEEDED FOR ALLERGIES 48 g 2   meloxicam (MOBIC) 15 MG tablet Take 15 mg by mouth daily.     tiZANidine (ZANAFLEX) 4 MG tablet Take 4 mg by mouth every 8 (eight) hours as needed.     traMADol  (ULTRAM ) 50 MG tablet Take 1 tablet (50 mg total) by mouth every 8 (eight) hours as needed. 90 tablet 5   tamsulosin  (FLOMAX ) 0.4 MG CAPS capsule Take 1 capsule (0.4 mg total) by mouth daily. (Patient not taking: Reported on 05/17/2023) 10 capsule 2   No facility-administered medications prior to visit.    Allergies  Allergen Reactions   Chlorhexidine  Gluconate Itching    Skin became reddened and itchy.    Review of Systems As per HPI  PE:    11/18/2023    3:21 PM 08/21/2023    8:27 AM 05/17/2023    3:11 PM  Vitals with BMI  Height 5' 9    Weight 205 lbs 13 oz 204 lbs 3 oz 199 lbs 3 oz  BMI 30.38    Systolic 136 129 883  Diastolic 86 84 82  Pulse 86 72 70     Physical Exam  ***  LABS:  Last CBC Lab Results  Component Value Date   WBC 11.3 (H) 10/29/2019   HGB 15.6 10/29/2019   HCT 46.9 10/29/2019   MCV 94.1 10/29/2019   MCH 30.8 12/27/2018   RDW 13.4 10/29/2019   PLT 299.0 10/29/2019   Last metabolic panel Lab Results  Component Value Date   GLUCOSE 85 10/29/2019   NA 142 10/29/2019   K 4.7 10/29/2019   CL 105 10/29/2019   CO2 30 10/29/2019   BUN 15 10/29/2019   CREATININE 0.98 10/29/2019   GFR 84.14 10/29/2019   CALCIUM 9.5 10/29/2019   PROT 6.8 10/29/2019   ALBUMIN 4.6 10/29/2019   BILITOT 0.3 10/29/2019   ALKPHOS 119 (H) 10/29/2019   AST 23 10/29/2019   ALT 30 10/29/2019   ANIONGAP 7 12/27/2018   IMPRESSION AND PLAN:  No problem-specific Assessment & Plan notes found for this encounter.   An After Visit Summary was printed and given to the patient.  FOLLOW UP: No follow-ups on file.  Signed:  Gerlene Hockey, MD           05/27/2024

## 2024-05-28 ENCOUNTER — Encounter: Payer: Self-pay | Admitting: Family Medicine

## 2024-06-01 ENCOUNTER — Ambulatory Visit: Admitting: Family Medicine

## 2024-06-01 ENCOUNTER — Encounter: Payer: Self-pay | Admitting: Family Medicine

## 2024-06-01 VITALS — BP 110/71 | HR 73 | Temp 98.7°F | Ht 69.0 in | Wt 206.2 lb

## 2024-06-01 DIAGNOSIS — Z79899 Other long term (current) drug therapy: Secondary | ICD-10-CM

## 2024-06-01 DIAGNOSIS — Z0000 Encounter for general adult medical examination without abnormal findings: Secondary | ICD-10-CM

## 2024-06-01 DIAGNOSIS — F3342 Major depressive disorder, recurrent, in full remission: Secondary | ICD-10-CM

## 2024-06-01 DIAGNOSIS — Z1211 Encounter for screening for malignant neoplasm of colon: Secondary | ICD-10-CM

## 2024-06-01 DIAGNOSIS — F411 Generalized anxiety disorder: Secondary | ICD-10-CM | POA: Diagnosis not present

## 2024-06-01 LAB — LIPID PANEL
Cholesterol: 215 mg/dL — ABNORMAL HIGH (ref 0–200)
HDL: 46.9 mg/dL (ref >39.00)
LDL Cholesterol: 125 mg/dL — ABNORMAL HIGH (ref 0–99)
NonHDL: 167.99
Total CHOL/HDL Ratio: 5
Triglycerides: 213 mg/dL — ABNORMAL HIGH (ref 0.0–149.0)
VLDL: 42.6 mg/dL — ABNORMAL HIGH (ref 0.0–40.0)

## 2024-06-01 LAB — CBC WITH DIFFERENTIAL/PLATELET
Basophils Absolute: 0.1 K/uL (ref 0.0–0.1)
Basophils Relative: 0.5 % (ref 0.0–3.0)
Eosinophils Absolute: 0.2 K/uL (ref 0.0–0.7)
Eosinophils Relative: 1.5 % (ref 0.0–5.0)
HCT: 44.8 % (ref 39.0–52.0)
Hemoglobin: 14.9 g/dL (ref 13.0–17.0)
Lymphocytes Relative: 26.9 % (ref 12.0–46.0)
Lymphs Abs: 2.8 K/uL (ref 0.7–4.0)
MCHC: 33.3 g/dL (ref 30.0–36.0)
MCV: 90.9 fl (ref 78.0–100.0)
Monocytes Absolute: 0.7 K/uL (ref 0.1–1.0)
Monocytes Relative: 6.9 % (ref 3.0–12.0)
Neutro Abs: 6.6 K/uL (ref 1.4–7.7)
Neutrophils Relative %: 64.2 % (ref 43.0–77.0)
Platelets: 299 K/uL (ref 150.0–400.0)
RBC: 4.93 Mil/uL (ref 4.22–5.81)
RDW: 14 % (ref 11.5–15.5)
WBC: 10.3 K/uL (ref 4.0–10.5)

## 2024-06-01 LAB — COMPREHENSIVE METABOLIC PANEL WITH GFR
ALT: 17 U/L (ref 0–53)
AST: 19 U/L (ref 0–37)
Albumin: 4.5 g/dL (ref 3.5–5.2)
Alkaline Phosphatase: 131 U/L — ABNORMAL HIGH (ref 39–117)
BUN: 13 mg/dL (ref 6–23)
CO2: 32 meq/L (ref 19–32)
Calcium: 9.6 mg/dL (ref 8.4–10.5)
Chloride: 101 meq/L (ref 96–112)
Creatinine, Ser: 0.92 mg/dL (ref 0.40–1.50)
GFR: 100.14 mL/min (ref >60.00)
Glucose, Bld: 103 mg/dL — ABNORMAL HIGH (ref 70–99)
Potassium: 5.4 meq/L — ABNORMAL HIGH (ref 3.5–5.1)
Sodium: 141 meq/L (ref 135–145)
Total Bilirubin: 0.3 mg/dL (ref 0.2–1.2)
Total Protein: 6.8 g/dL (ref 6.0–8.3)

## 2024-06-01 LAB — TSH: TSH: 2.01 u[IU]/mL (ref 0.35–5.50)

## 2024-06-01 MED ORDER — ALPRAZOLAM 1 MG PO TABS: ORAL_TABLET | ORAL | 1 refills | Status: DC

## 2024-06-01 NOTE — Progress Notes (Signed)
 OFFICE VISIT  06/01/2024  CC:  Chief Complaint  Patient presents with   Medical Management of Chronic Issues    Patient is a 48 y.o. male who presents for annual health maintenance exam and 66-month follow-up anxiety and depression. A/P as of last visit: #1 recurrent MDD, GAD with history of panic attacks. He is doing well with fluoxetine  40 mg a day.  Also alprazolam  1 mg tabs, 2 tabs twice a day.  New prescriptions were not needed today.   2 chronic pain syndrome.  Chronic neck pain, cerv DDD/spondylosis. Mild-to-mod, well managed with activity modification and as -needed use of tramadol . I will assume responsibility for prescribing his tramadol  in the future.  New prescription was not needed today. His plan is to see how he does over the next couple of years with unrestricted duty at work and then reevaluate the need for any surgery with his neurosurgeon at that time  INTERIM HX: Max got neck surgery again about 6 weeks ago.  Still hurting quite a bit.  Has follow-up next month with neurosurgeon. Taking Percocet usually 1 tab at night.  He saw a psychiatrist since I last saw him.  He was prescribed Seroquel for sleep.  Currently taking 100 mg dose and feels like the effect is not as good as before.  He took 100 mg +25 mg recently and says it worked well.  He has follow-up with psychiatry as planned.  Otherwise, he describes being under lots of stress: He is busy with engineering classes, his Worker's Comp. case is getting complicated.  Denies depressed mood. He does continue to smoke cigarettes. He is unable to exercise at all due to his neck pain.  PMP AWARE reviewed today: most recent rx for Percocet 5-325 was filled 05/27/2024, # 20, rx by Suzen Pean. No red flags.  Past Medical History:  Diagnosis Date   alcoholism    [facility] 03/2021   Cervical radiculopathy    Dr. Alm Molt   GERD (gastroesophageal reflux disease)    24h pH probe FAILED 03/31/15.  Hx  of erosive esophagitis on EGD 03/31/15   History of chest pain 2019   Cath 02/2018 (pt declined stress testing b/c he has little faith in them): entirely clean.   History of cocaine abuse (HCC)    s/p rehab 2001: no drug abuse since that time.   History of kidney stones    Lumbar spondylosis    with mild spondylolisthesis L5 on S1.  Intermittent LBP with R leg radiculopathy.   Organic erectile dysfunction    Recurrent kidney stones    Ca++ oxalate.  Flomax  helps pass.  ++Recurrent--has seen urologist in remote past.   Recurrent major depression (HCC)    Seasonal allergic rhinitis    Tobacco dependence    Ureterolithiasis 10/2018   ESWL planned as of 10/24/2018 urol o/v.    Past Surgical History:  Procedure Laterality Date   BRAVO [facility] STUDY  03/31/2015   Failed 48 - hour Bravo pH probe - Dr. Oneil Brass   ESOPHAGEAL MANOMETRY  04/29/2015   Essentially normal esophageal manometry. - Dr. Oneil Brass   EXTRACORPOREAL SHOCK WAVE LITHOTRIPSY Left 10/27/2018   Procedure: EXTRACORPOREAL SHOCK WAVE LITHOTRIPSY (ESWL);  Surgeon: Sherrilee Belvie CROME, MD;  Location: [facility] OR[facility];  Service: Urology;  Laterality: Left;   LEFT HEART CATH AND CORONARY ANGIOGRAPHY N/A 02/07/2018   No CAD.  EF normal.  Procedure: LEFT HEART CATH AND CORONARY ANGIOGRAPHY;  Surgeon: Dann Candyce RAMAN, MD;  Location: [facility] INVASIVE CV LAB;  Service: Cardiovascular;  Laterality: N/A;   UPPER GI ENDOSCOPY  12/29/2014   LA Grade A reflus esophagitis, otherwise normal. - Dr. Oneil Brass   Social History   Socioeconomic History   Marital status: Married    Spouse name: Not on file   Number of children: Not on file   Years of education: Not on file   Highest education level: Some college, no degree  Occupational History   Not on file  Tobacco Use   Smoking status: Every Day    Current packs/day: 1.00    Average packs/day: 1 pack/day for 26.0 years (26.0 ttl pk-yrs)    Types: Cigarettes   Smokeless tobacco: Never  Vaping Use    Vaping status: Former  Substance and Sexual Activity   Alcohol  use: Yes    Alcohol /week: 12.0 standard drinks of alcohol     Types: 12 Cans of beer per week   Drug use: No   Sexual activity: Not on file  Other Topics Concern   Not on file  Social History Narrative   Married, no children.   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   Tob:25 pack-yr hx as of 11/2017.   Alc: denies hx of abuse   Drugs:hx of street drug abuse in 25s, jail for a brief period, rehab.     Clean since 2001.   Social Drivers of Corporate investment banker Strain: Low Risk  (05/31/2024)   Overall Financial Resource Strain (CARDIA)    Difficulty of Paying Living Expenses: Not very hard  Food Insecurity: No Food Insecurity (05/31/2024)   Hunger Vital Sign    Worried About Running Out of Food in the Last Year: Never true    Ran Out of Food in the Last Year: Never true  Transportation Needs: No Transportation Needs (05/31/2024)   PRAPARE - Administrator, Civil Service (Medical): No    Lack of Transportation (Non-Medical): No  Physical Activity: Inactive (05/31/2024)   Exercise Vital Sign    Days of Exercise per Week: 0 days    Minutes of Exercise per Session: Not on file  Stress: Stress Concern Present (05/31/2024)   [employer] of Occupational Health - Occupational Stress Questionnaire    Feeling of Stress: Rather much  Social Connections: Socially Isolated (05/31/2024)   Social Connection and Isolation Panel    Frequency of Communication with Friends and Family: Once a week    Frequency of Social Gatherings with Friends and Family: Once a week    Attends Religious Services: Never    Database administrator or Organizations: No    Attends Engineer, structural: Not on file    Marital Status: Married   Family History  Problem Relation Age of Onset   Arthritis Father    Early death Father    Heart attack Father    Heart disease Father     Hyperlipidemia Father    Hypertension Father    Early death Paternal Grandfather    Hearing loss Paternal Grandfather    Heart attack Paternal Grandfather    Heart disease Paternal Grandfather    Hyperlipidemia Paternal Grandfather    Hypertension Paternal Grandfather    ROS:  Outpatient Medications Prior to Visit  Medication Sig Dispense Refill   esomeprazole  (NEXIUM ) 40 MG capsule TAKE 1 CAPSULE DAILY AT 12:00 P.M. 90 capsule 0   FLUoxetine  (PROZAC ) 40 MG  capsule TAKE 1 CAPSULE DAILY 90 capsule 1   fluticasone  (FLONASE ) 50 MCG/ACT nasal spray USE 2 SPRAYS IN EACH NOSTRIL DAILY AS NEEDED FOR ALLERGIES 48 g 2   PREVIDENT 5000 BOOSTER PLUS 1.1 % PSTE      QUEtiapine (SEROQUEL) 100 MG tablet Take 100 mg by mouth at bedtime as needed.     tiZANidine (ZANAFLEX) 4 MG tablet Take 4 mg by mouth every 8 (eight) hours as needed.     ALPRAZolam  (XANAX ) 1 MG tablet 2 tabs po bid 360 tablet 0   traMADol  (ULTRAM ) 50 MG tablet Take 1 tablet (50 mg total) by mouth every 8 (eight) hours as needed. 90 tablet 5   meloxicam (MOBIC) 15 MG tablet Take 15 mg by mouth daily.     No facility-administered medications prior to visit.    Allergies  Allergen Reactions   Chlorhexidine Gluconate Itching    Skin became reddened and itchy.    Review of Systems  Constitutional:  Negative for appetite change, chills, fatigue and fever.  HENT:  Negative for congestion, dental problem, ear pain and sore throat.   Eyes:  Negative for discharge, redness and visual disturbance.  Respiratory:  Negative for cough, chest tightness, shortness of breath and wheezing.   Cardiovascular:  Negative for chest pain, palpitations and leg swelling.  Gastrointestinal:  Negative for abdominal pain, blood in stool, diarrhea, nausea and vomiting.  Genitourinary:  Negative for difficulty urinating, dysuria, flank pain, frequency, hematuria and urgency.  Musculoskeletal:  Positive for neck pain and neck stiffness. Negative for  arthralgias, back pain, joint swelling and myalgias.  Skin:  Negative for pallor and rash.  Neurological:  Negative for dizziness, speech difficulty, weakness and headaches.  Hematological:  Negative for adenopathy. Does not bruise/bleed easily.  Psychiatric/Behavioral:  Negative for confusion and sleep disturbance. The patient is not nervous/anxious.    As per HPI  PE:    06/01/2024    1:01 PM 11/18/2023    3:21 PM 08/21/2023    8:27 AM  Vitals with BMI  Height 5' 9 5' 9   Weight 206 lbs 3 oz 205 lbs 13 oz 204 lbs 3 oz  BMI 30.44 30.38   Systolic 110 136 870  Diastolic 71 86 84  Pulse 73 86 72   Physical Exam  Gen: Alert, well appearing.  Patient is oriented to person, place, time, and situation. AFFECT: pleasant, lucid thought and speech. ENT: Ears: EACs clear, normal epithelium.  TMs with good light reflex and landmarks bilaterally.  Eyes: no injection, icteris, swelling, or exudate.  EOMI, PERRLA. Nose: no drainage or turbinate edema/swelling.  No injection or focal lesion.  Mouth: lips without lesion/swelling.  Oral mucosa pink and moist.  Dentition intact and without obvious caries or gingival swelling.  Oropharynx without erythema, exudate, or swelling.  Neck: supple/nontender.  No LAD, mass, or TM.  Carotid pulses 2+ bilaterally, without bruits. CV: RRR, no m/r/g.   LUNGS: CTA bilat, nonlabored resps, good aeration in all lung fields. ABD: soft, NT, ND, BS normal.  No hepatospenomegaly or mass.  No bruits. EXT: no clubbing, cyanosis, or edema.  Musculoskeletal: no joint swelling, erythema, warmth, or tenderness.  ROM of all joints intact. Skin - no sores or suspicious lesions or rashes or color changes   LABS:  Last CBC Lab Results  Component Value Date   WBC 11.3 (H) 10/29/2019   HGB 15.6 10/29/2019   HCT 46.9 10/29/2019   MCV 94.1 10/29/2019   MCH 30.8  12/27/2018   RDW 13.4 10/29/2019   PLT 299.0 10/29/2019   Last metabolic panel Lab Results  Component  Value Date   GLUCOSE 85 10/29/2019   NA 142 10/29/2019   K 4.7 10/29/2019   CL 105 10/29/2019   CO2 30 10/29/2019   BUN 15 10/29/2019   CREATININE 0.98 10/29/2019   GFR 84.14 10/29/2019   CALCIUM 9.5 10/29/2019   PROT 6.8 10/29/2019   ALBUMIN 4.6 10/29/2019   BILITOT 0.3 10/29/2019   ALKPHOS 119 (H) 10/29/2019   AST 23 10/29/2019   ALT 30 10/29/2019   ANIONGAP 7 12/27/2018   Lab Results  Component Value Date   CHOL 207 (H) 10/29/2019   HDL 48.20 10/29/2019   LDLDIRECT 121.0 10/29/2019   TRIG 201.0 (H) 10/29/2019   CHOLHDL 4 10/29/2019   Lab Results  Component Value Date   TSH 1.65 10/29/2019   IMPRESSION AND PLAN:  #1 health maintenance exam: Reviewed age and gender appropriate health maintenance issues (prudent diet, regular exercise, health risks of tobacco and excessive alcohol , use of seatbelts, fire alarms in home, use of sunscreen).  Also reviewed age and gender appropriate health screening as well as vaccine recommendations. Vaccines: He defers Prevnar at this time Labs: Fasting health panel today Prostate ca screening: Average risk, start PSA screening at age 34. Colon ca screening: He is average risk for colon cancer.  Options discussed today. Ordered Cologuard. Encouraged smoking cessation.  2.  GAD, recurrent major depressive disorder. Overall he is pretty stable, especially considering the stressful circumstances in his life lately. Continue alprazolam  1 mg tab, 2 tabs twice a day, #360 for  70-month supply, refill x 1. Continue fluoxetine  40 mg a day. He will also keep follow-up with his psychiatrist who prescribes his Seroquel for sleep.  An After Visit Summary was printed and given to the patient.  FOLLOW UP: Return in about 6 months (around 12/02/2024) for routine chronic illness f/u.  Signed:  Gerlene Hockey, MD           06/01/2024

## 2024-06-02 ENCOUNTER — Ambulatory Visit: Payer: Self-pay | Admitting: Family Medicine

## 2024-06-03 ENCOUNTER — Encounter: Payer: Self-pay | Admitting: Family Medicine

## 2024-06-09 DIAGNOSIS — Z1211 Encounter for screening for malignant neoplasm of colon: Secondary | ICD-10-CM | POA: Diagnosis not present

## 2024-06-10 ENCOUNTER — Ambulatory Visit: Admitting: Family Medicine

## 2024-06-17 ENCOUNTER — Encounter: Payer: Self-pay | Admitting: Family Medicine

## 2024-06-17 LAB — COLOGUARD: COLOGUARD: NEGATIVE

## 2024-07-09 ENCOUNTER — Encounter: Payer: Self-pay | Admitting: Family Medicine

## 2024-07-09 DIAGNOSIS — M47812 Spondylosis without myelopathy or radiculopathy, cervical region: Secondary | ICD-10-CM

## 2024-07-09 DIAGNOSIS — G8929 Other chronic pain: Secondary | ICD-10-CM

## 2024-07-09 DIAGNOSIS — Z9889 Other specified postprocedural states: Secondary | ICD-10-CM

## 2024-07-12 NOTE — Telephone Encounter (Signed)
 OK, referral to [facility] neurosurgery ordered

## 2024-07-13 NOTE — Telephone Encounter (Signed)
 No further action needed at this time.

## 2024-07-23 ENCOUNTER — Ambulatory Visit (INDEPENDENT_AMBULATORY_CARE_PROVIDER_SITE_OTHER)

## 2024-07-23 DIAGNOSIS — Z23 Encounter for immunization: Secondary | ICD-10-CM

## 2024-07-23 NOTE — Progress Notes (Signed)
 Pt in for regular dose flu vaccine  Injection tolerated well  Vaccine handout given to pt.

## 2024-08-03 ENCOUNTER — Other Ambulatory Visit: Payer: Self-pay | Admitting: Family Medicine

## 2024-08-06 ENCOUNTER — Other Ambulatory Visit: Payer: Self-pay | Admitting: Family Medicine

## 2024-08-06 DIAGNOSIS — Z981 Arthrodesis status: Secondary | ICD-10-CM | POA: Diagnosis not present

## 2024-08-07 ENCOUNTER — Encounter: Payer: Self-pay | Admitting: Family Medicine

## 2024-08-09 MED ORDER — TRAMADOL HCL 50 MG PO TABS: 50.0000 mg | ORAL_TABLET | Freq: Three times a day (TID) | ORAL | 0 refills | Status: DC | PRN

## 2024-10-22 ENCOUNTER — Encounter: Payer: Self-pay | Admitting: Family Medicine

## 2024-10-22 NOTE — Patient Instructions (Incomplete)
 Larry House

## 2024-10-23 ENCOUNTER — Telehealth: Payer: Self-pay

## 2024-10-23 ENCOUNTER — Ambulatory Visit: Admitting: Family Medicine

## 2024-10-23 ENCOUNTER — Encounter: Payer: Self-pay | Admitting: Family Medicine

## 2024-10-23 ENCOUNTER — Other Ambulatory Visit (HOSPITAL_COMMUNITY): Payer: Self-pay

## 2024-10-23 VITALS — BP 129/85 | HR 84 | Temp 98.8°F | Ht 69.0 in | Wt 221.2 lb

## 2024-10-23 DIAGNOSIS — R635 Abnormal weight gain: Secondary | ICD-10-CM

## 2024-10-23 DIAGNOSIS — R5382 Chronic fatigue, unspecified: Secondary | ICD-10-CM

## 2024-10-23 DIAGNOSIS — F411 Generalized anxiety disorder: Secondary | ICD-10-CM

## 2024-10-23 DIAGNOSIS — E875 Hyperkalemia: Secondary | ICD-10-CM | POA: Diagnosis not present

## 2024-10-23 DIAGNOSIS — Z79899 Other long term (current) drug therapy: Secondary | ICD-10-CM | POA: Diagnosis not present

## 2024-10-23 DIAGNOSIS — R7301 Impaired fasting glucose: Secondary | ICD-10-CM | POA: Diagnosis not present

## 2024-10-23 MED ORDER — SEMAGLUTIDE(0.25 OR 0.5MG/DOS) 2 MG/3ML ~~LOC~~ SOPN: PEN_INJECTOR | SUBCUTANEOUS | 0 refills | Status: DC

## 2024-10-23 MED ORDER — ALPRAZOLAM 1 MG PO TABS: ORAL_TABLET | ORAL | 1 refills | Status: AC

## 2024-10-23 NOTE — Progress Notes (Signed)
 OFFICE VISIT  10/23/2024  CC:  Chief Complaint  Patient presents with   Fatigue    Request vitamin panel for any deficiencies   Weight Management    Pt confirmed Ozempic  is on formulary    Patient is a 49 y.o. male who presents for 81-month follow-up anxiety, chronic fatigue. A/P as of last visit: GAD, recurrent major depressive disorder. Overall he is pretty stable, especially considering the stressful circumstances in his life lately. Continue alprazolam  1 mg tab, 2 tabs twice a day, #360 for  33-month supply, refill x 1. Continue fluoxetine  40 mg a day. He will also keep follow-up with his psychiatrist who prescribes his Seroquel for sleep.  INTERIM HX: Max feels chronic fatigue. He requests vitamin B12 and vitamin D levels.  He is gaining weight despite limiting calories.  He has some strict activity limitations due to neck problems, followed by neurosurgery.  Chronic anxiety level pretty stable.  Mood stable. Currently taking Celebrex for pain.  PMP AWARE reviewed today: most recent rx for tramadol  was filled 08/10/2024, # 90, rx by me.  Most recent alprazolam  prescription was filled 07/22/2024, #360, prescription by me. Most recent Percocet prescription was filled 08/13/2024, #20, prescription by Alm Molt, MD. No red flags.   Past Medical History:  Diagnosis Date   alcoholism    [facility] 03/2021   Cervical radiculopathy    Dr. Alm Molt   Colon cancer screening    06/2024 cologuard NEG   GERD (gastroesophageal reflux disease)    24h pH probe FAILED 03/31/15.  Hx of erosive esophagitis on EGD 03/31/15   History of chest pain 2019   Cath 02/2018 (pt declined stress testing b/c he has little faith in them): entirely clean.   History of cocaine abuse (HCC)    s/p rehab 2001: no drug abuse since that time.   History of kidney stones    Lumbar spondylosis    with mild spondylolisthesis L5 on S1.  Intermittent LBP with R leg radiculopathy.   Organic  erectile dysfunction    Recurrent kidney stones    Ca++ oxalate.  Flomax  helps pass.  ++Recurrent--has seen urologist in remote past.   Recurrent major depression    Seasonal allergic rhinitis    Tobacco dependence    Ureterolithiasis 10/2018   ESWL planned as of 10/24/2018 urol o/v.    Past Surgical History:  Procedure Laterality Date   BRAVO [facility] STUDY  03/31/2015   Failed 48 - hour Bravo pH probe - Dr. Oneil Brass   ESOPHAGEAL MANOMETRY  04/29/2015   Essentially normal esophageal manometry. - Dr. Oneil Brass   EXTRACORPOREAL SHOCK WAVE LITHOTRIPSY Left 10/27/2018   Procedure: EXTRACORPOREAL SHOCK WAVE LITHOTRIPSY (ESWL);  Surgeon: Sherrilee Belvie CROME, MD;  Location: [facility];  Service: Urology;  Laterality: Left;   LEFT HEART CATH AND CORONARY ANGIOGRAPHY N/A 02/07/2018   No CAD.  EF normal.  Procedure: LEFT HEART CATH AND CORONARY ANGIOGRAPHY;  Surgeon: Dann Candyce RAMAN, MD;  Location: [facility] INVASIVE CV LAB;  Service: Cardiovascular;  Laterality: N/A;   UPPER GI ENDOSCOPY  12/29/2014   LA Grade A reflus esophagitis, otherwise normal. - Dr. Oneil Brass    Outpatient Medications Prior to Visit  Medication Sig Dispense Refill   Celecoxib (CELEBREX PO) Take 200 mg by mouth 2 (two) times daily as needed.     cyclobenzaprine (FLEXERIL) 10 MG tablet Take 10 mg by mouth 3 (three) times daily as needed for muscle spasms.  esomeprazole  (NEXIUM ) 40 MG capsule TAKE 1 CAPSULE DAILY AT 12:00 P.M. 90 capsule 0   FLUoxetine  (PROZAC ) 40 MG capsule TAKE 1 CAPSULE DAILY 90 capsule 0   fluticasone  (FLONASE ) 50 MCG/ACT nasal spray USE 2 SPRAYS IN EACH NOSTRIL DAILY AS NEEDED FOR ALLERGIES 48 g 2   PREVIDENT 5000 BOOSTER PLUS 1.1 % PSTE      ALPRAZolam  (XANAX ) 1 MG tablet 2 tabs po bid 360 tablet 1   tiZANidine (ZANAFLEX) 4 MG tablet Take 4 mg by mouth every 8 (eight) hours as needed.     QUEtiapine (SEROQUEL) 100 MG tablet Take 100 mg by mouth at bedtime as needed. (Patient not taking: Reported on  10/23/2024)     traMADol  (ULTRAM ) 50 MG tablet Take 1 tablet (50 mg total) by mouth every 8 (eight) hours as needed. (Patient not taking: Reported on 10/23/2024) 90 tablet 0   No facility-administered medications prior to visit.    Allergies[1]  Review of Systems As per HPI  PE:    10/23/2024    3:30 PM 06/01/2024    1:01 PM 11/18/2023    3:21 PM  Vitals with BMI  Height 5' 9 5' 9 5' 9  Weight 221 lbs 3 oz 206 lbs 3 oz 205 lbs 13 oz  BMI 32.65 30.44 30.38  Systolic 129 110 863  Diastolic 85 71 86  Pulse 84 73 86     Physical Exam  Gen: Alert, well appearing.  Patient is oriented to person, place, time, and situation. AFFECT: pleasant, lucid thought and speech. No further exam today  LABS:  Last CBC Lab Results  Component Value Date   WBC 10.3 06/01/2024   HGB 14.9 06/01/2024   HCT 44.8 06/01/2024   MCV 90.9 06/01/2024   MCH 30.8 12/27/2018   RDW 14.0 06/01/2024   PLT 299.0 06/01/2024   Last metabolic panel Lab Results  Component Value Date   GLUCOSE 103 (H) 06/01/2024   NA 141 06/01/2024   K 5.4 (H) 06/01/2024   CL 101 06/01/2024   CO2 32 06/01/2024   BUN 13 06/01/2024   CREATININE 0.92 06/01/2024   GFR 100.14 06/01/2024   CALCIUM 9.6 06/01/2024   PROT 6.8 06/01/2024   ALBUMIN 4.5 06/01/2024   BILITOT 0.3 06/01/2024   ALKPHOS 131 (H) 06/01/2024   AST 19 06/01/2024   ALT 17 06/01/2024   ANIONGAP 7 12/27/2018   Last lipids Lab Results  Component Value Date   CHOL 215 (H) 06/01/2024   HDL 46.90 06/01/2024   LDLCALC 125 (H) 06/01/2024   LDLDIRECT 121.0 10/29/2019   TRIG 213.0 (H) 06/01/2024   CHOLHDL 5 06/01/2024   Last thyroid functions Lab Results  Component Value Date   TSH 2.01 06/01/2024   IMPRESSION AND PLAN:  #1 chronic fatigue. Complete blood count and thyroid and metabolic panel (other than mild hyperkalemia) have all been normal as recently as August 2025. We will check BMP today, magnesium , vitamin B12, and vitamin D.  #2  abnormal weight gain. Weight is up 15 pounds in the last 4 months.  He is unable to burn any calories because of strict physical activity limitations from his neck diagnosis. BMI 32.6. He has had some mild impaired fasting glucose in the past as well as hypertriglyceridemia. Check hemoglobin A1c today. Start Ozempic  0.5 mg weekly. Therapeutic expectations and side effect profile of medication discussed today.  Patient's questions answered.  #3 GAD. Doing well long-term on 1 mg alprazolam --> GAD 2 tabs twice a  day in addition to fluoxetine  40 mg a day.  An After Visit Summary was printed and given to the patient.  FOLLOW UP: Return in about 4 weeks (around 11/20/2024) for f/u wt mgmt. Next CPE August/September 2026 Signed:  Gerlene Hockey, MD           10/23/2024       [1]  Allergies Allergen Reactions   Chlorhexidine Gluconate Itching    Skin became reddened and itchy.

## 2024-10-23 NOTE — Telephone Encounter (Signed)
 Pharmacy Patient Advocate Encounter   Received notification from Physician's Office that prior authorization for ALPRAZolam  1MG  tablets is required/requested.   Insurance verification completed.   The patient is insured through [employer].   Per test claim: PA required; PA submitted to above mentioned insurance via Latent Key/confirmation #/EOC [id number] Status is pending

## 2024-10-23 NOTE — Telephone Encounter (Signed)
 Please advise if PA is needed for ALPRAZolam  (XANAX ) 1 MG tablet.

## 2024-10-24 ENCOUNTER — Ambulatory Visit: Payer: Self-pay | Admitting: Family Medicine

## 2024-10-24 LAB — VITAMIN B12: Vitamin B-12: 457 pg/mL (ref 200–1100)

## 2024-10-24 LAB — BASIC METABOLIC PANEL WITH GFR
BUN: 11 mg/dL (ref 7–25)
CO2: 29 mmol/L (ref 20–32)
Calcium: 9.6 mg/dL (ref 8.6–10.3)
Chloride: 104 mmol/L (ref 98–110)
Creat: 1.01 mg/dL (ref 0.60–1.29)
Glucose, Bld: 95 mg/dL (ref 65–99)
Potassium: 4.8 mmol/L (ref 3.5–5.3)
Sodium: 139 mmol/L (ref 135–146)
eGFR: 93 mL/min/1.73m2

## 2024-10-24 LAB — MAGNESIUM: Magnesium: 1.9 mg/dL (ref 1.5–2.5)

## 2024-10-24 LAB — HEMOGLOBIN A1C
Hgb A1c MFr Bld: 6.1 % — ABNORMAL HIGH
Mean Plasma Glucose: 128 mg/dL
eAG (mmol/L): 7.1 mmol/L

## 2024-10-24 LAB — VITAMIN D 25 HYDROXY (VIT D DEFICIENCY, FRACTURES): Vit D, 25-Hydroxy: 21 ng/mL — ABNORMAL LOW (ref 30–100)

## 2024-10-26 ENCOUNTER — Telehealth: Payer: Self-pay

## 2024-10-26 ENCOUNTER — Other Ambulatory Visit (HOSPITAL_COMMUNITY): Payer: Self-pay

## 2024-10-26 NOTE — Telephone Encounter (Signed)
 Unfortunately, his insurance is not going to pay for GLP-1 for the treatment of obesity (only for treatment of diabetes).

## 2024-10-26 NOTE — Telephone Encounter (Signed)
 Pharmacy Patient Advocate Encounter  Received notification from [facility] that Prior Authorization for  ALPRAZolam  1MG  tablets has been APPROVED from 09/23/2024 to 10/23/2025. Unable to obtain price due to refill too soon rejection, last fill date 10/23/2024 next available fill date 12/29/2024   PA #/Case ID/Reference #: [id number]

## 2024-10-26 NOTE — Telephone Encounter (Signed)
 No further action needed at this time. Message sent to PA team through separate phone encounter.

## 2024-10-26 NOTE — Telephone Encounter (Signed)
 Pt states PA is needed for Semaglutide ,0.25 or 0.5MG /DOS, 2 MG/3ML SOPN. Please advise.

## 2024-10-26 NOTE — Telephone Encounter (Signed)
 Ozempic Brena is approved exclusively as an adjunct to diet and exercise to improve glycemic control in adults with type 2 diabetes mellitus. A review of patient's medical chart reveals no documented diagnosis of type 2 diabetes or an A1C indicative of diabetes. Therefore, they do not currently meet the criteria for prior authorization of this medication. If clinically appropriate, alternative  options such as Saxenda, Zepbound, or Wegovy  may be considered for this patient.

## 2024-10-27 ENCOUNTER — Other Ambulatory Visit (HOSPITAL_COMMUNITY): Payer: Self-pay

## 2024-10-27 ENCOUNTER — Telehealth: Payer: Self-pay

## 2024-10-27 MED ORDER — PHENTERMINE HCL 37.5 MG PO TABS: 37.5000 mg | ORAL_TABLET | Freq: Every day | ORAL | 0 refills | Status: AC

## 2024-10-27 NOTE — Telephone Encounter (Signed)
 Larry House

## 2024-10-27 NOTE — Telephone Encounter (Signed)
 Message reviewed by CMA. Will check other encounter for updates

## 2024-10-27 NOTE — Addendum Note (Signed)
 Addended by: CANDISE ALEENE DEL on: 10/27/2024 01:15 PM   Modules accepted: Orders

## 2024-10-27 NOTE — Telephone Encounter (Signed)
 Pharmacy Patient Advocate Encounter  Received notification from [facility] that Prior Authorization for Phentermine  HCl 37.5MG  tablets has been APPROVED from 09/27/2024 to 01/25/2025. Ran test claim, Copay is $4.68. This test claim was processed through [facility]- copay amounts may vary at other pharmacies due to pharmacy/plan contracts, or as the patient moves through the different stages of their insurance plan.   PA #/Case ID/Reference #: [id number]

## 2024-10-27 NOTE — Telephone Encounter (Signed)
 OK, phentermine  rx sent. Keep plan for f/u 1 mo

## 2024-10-27 NOTE — Telephone Encounter (Signed)
 Pharmacy Patient Advocate Encounter   Received notification from Physician's Office that prior authorization for Phentermine  HCl 37.5MG  tablets  is required/requested.   Insurance verification completed.   The patient is insured through [employer].   Per test claim: PA required; PA submitted to above mentioned insurance via Latent Key/confirmation #/EOC [facility] Status is pending

## 2024-10-28 NOTE — Telephone Encounter (Signed)
 See MyChart encounter. MyChart message sent to make pt aware.

## 2024-10-30 ENCOUNTER — Other Ambulatory Visit: Payer: Self-pay | Admitting: Family Medicine

## 2024-11-04 ENCOUNTER — Ambulatory Visit: Admitting: Family Medicine

## 2024-11-19 ENCOUNTER — Ambulatory Visit: Admitting: Family Medicine

## 2024-11-23 ENCOUNTER — Ambulatory Visit: Admitting: Family Medicine

## 2024-12-07 ENCOUNTER — Ambulatory Visit: Admitting: Family Medicine
